# Patient Record
Sex: Female | Born: 1995 | Race: White | Hispanic: No | Smoking: Never smoker
Health system: Southern US, Community
[De-identification: ages and names within clinical notes are randomized; demographics above are authoritative.]

## PROBLEM LIST (undated history)

## (undated) DIAGNOSIS — N200 Calculus of kidney: Secondary | ICD-10-CM

## (undated) HISTORY — DX: Calculus of kidney: N20.0

## (undated) HISTORY — PX: MOUTH SURGERY: SHX715

---

## 2016-03-25 ENCOUNTER — Ambulatory Visit (INDEPENDENT_AMBULATORY_CARE_PROVIDER_SITE_OTHER): Payer: 59 | Admitting: Obstetrics and Gynecology

## 2016-03-25 ENCOUNTER — Encounter: Payer: Self-pay | Admitting: Obstetrics and Gynecology

## 2016-03-25 VITALS — BP 110/66 | HR 73 | Temp 98.0°F | Ht 64.0 in | Wt 148.0 lb

## 2016-03-25 DIAGNOSIS — Z01419 Encounter for gynecological examination (general) (routine) without abnormal findings: Secondary | ICD-10-CM | POA: Diagnosis not present

## 2016-03-25 NOTE — Progress Notes (Addendum)
Subjective:     Monique Barnett is a 20 y.o. female who is here for a comprehensive physical exam. The patient reports no problems. She reports normal menstrual cycles with 5 days of vaginal bleeding without passage of clots. She has been sexually active in the past without vaginal/anal penetration. She desires full STD testing today and declines contraception. She denies abnormal discharge and denies pelvic/abdominal pain  Past Medical History:  Diagnosis Date  . Kidney stone    Past Surgical History:  Procedure Laterality Date  . MOUTH SURGERY     Family History  Problem Relation Age of Onset  . Anxiety disorder Maternal Grandmother   . Cancer Maternal Grandfather   . Diabetes Paternal Grandfather     Social History   Social History  . Marital status: Single    Spouse name: N/A  . Number of children: N/A  . Years of education: N/A   Occupational History  . Not on file.   Social History Main Topics  . Smoking status: Never Smoker  . Smokeless tobacco: Never Used  . Alcohol use No  . Drug use: No  . Sexual activity: Not Currently    Partners: Male    Birth control/ protection: None   Other Topics Concern  . Not on file   Social History Narrative  . No narrative on file   Health Maintenance  Topic Date Due  . HIV Screening  01/06/2010  . TETANUS/TDAP  01/06/2014  . PAP SMEAR  01/07/2016  . INFLUENZA VACCINE  03/31/2016       Review of Systems Pertinent items are noted in HPI.   Objective:      GENERAL: Well-developed, well-nourished female in no acute distress.  HEENT: Normocephalic, atraumatic. Sclerae anicteric.  NECK: Supple. Normal thyroid.  LUNGS: Clear to auscultation bilaterally.  HEART: Regular rate and rhythm. BREASTS: Symmetric in size. No palpable masses or lymphadenopathy, skin changes, or nipple drainage. ABDOMEN: Soft, nontender, nondistended. No organomegaly. PELVIC: Normal external female genitalia. Vagina is pink and rugated.  Normal  discharge. Normal appearing cervix. Uterus is normal in size.  No adnexal mass or tenderness. EXTREMITIES: No cyanosis, clubbing, or edema, 2+ distal pulses.    Assessment:    Healthy female exam.      Plan:    Pap smear collected Discussed using condoms with every sexual encounter for STD prevention Full STD screen today Patient will be notified of abnormal results Patient reports receiving Gardasil series  See After Visit Summary for Counseling Recommendations

## 2016-03-26 LAB — RPR: RPR: NONREACTIVE

## 2016-03-26 LAB — HEPATITIS C ANTIBODY

## 2016-03-26 LAB — HIV ANTIBODY (ROUTINE TESTING W REFLEX): HIV Screen 4th Generation wRfx: NONREACTIVE

## 2016-03-27 LAB — GC/CHLAMYDIA PROBE AMP
CHLAMYDIA, DNA PROBE: NEGATIVE
NEISSERIA GONORRHOEAE BY PCR: NEGATIVE

## 2016-04-03 LAB — PAP IG W/ RFLX HPV ASCU: PAP SMEAR COMMENT: 0

## 2016-04-03 LAB — HPV DNA PROBE HIGH RISK, AMPLIFIED: HPV, HIGH-RISK: NEGATIVE

## 2016-04-20 ENCOUNTER — Telehealth: Payer: Self-pay | Admitting: Obstetrics and Gynecology

## 2016-04-20 NOTE — Telephone Encounter (Signed)
Called patient reviewed her results- let her know that she is OK to repeat her pap smear in a year

## 2016-04-20 NOTE — Telephone Encounter (Signed)
Pt was wondering if someone could giver her the results of her tests that were done back in July. Please advise.

## 2016-04-22 ENCOUNTER — Telehealth: Payer: Self-pay | Admitting: *Deleted

## 2016-04-22 NOTE — Telephone Encounter (Signed)
Patient made aware of lab results and need for repeat PAP in a year.

## 2016-05-05 NOTE — Telephone Encounter (Signed)
-----   Message from Arne ClevelandMandy J Hutchinson, [place] sent at 04/21/2016 10:14 AM EDT ----- [facility] and let her know her labs are normal per Dr. Jolayne Pantheronstant.   Thank you Janit BernMandy Hutchinson, CMA ----- Message ----- From: Catalina AntiguaPeggy Constant, MD Sent: 04/21/2016   7:30 AM To: Lenda KelpMandy J Hutchinson, CMA  Yes I did. Everything is normal  Thank you  Peggy   ----- Message ----- From: Arne ClevelandMandy J Hutchinson, CMA Sent: 04/20/2016   2:46 PM To: Catalina AntiguaPeggy Constant, MD  Have you had a chance to review these results?

## 2016-10-04 ENCOUNTER — Encounter (HOSPITAL_COMMUNITY): Payer: Self-pay

## 2016-10-04 ENCOUNTER — Emergency Department (HOSPITAL_COMMUNITY)
Admission: EM | Admit: 2016-10-04 | Discharge: 2016-10-04 | Disposition: A | Discharge status: Home / Self Care | Payer: Self-pay | Attending: Emergency Medicine | Admitting: Emergency Medicine

## 2016-10-04 DIAGNOSIS — R55 Syncope and collapse: Secondary | ICD-10-CM | POA: Insufficient documentation

## 2016-10-04 DIAGNOSIS — R42 Dizziness and giddiness: Secondary | ICD-10-CM

## 2016-10-04 LAB — I-STAT CHEM 8, ED
BUN: 20 mg/dL (ref 6–20)
CHLORIDE: 103 mmol/L (ref 101–111)
CREATININE: 0.7 mg/dL (ref 0.44–1.00)
Calcium, Ion: 1.18 mmol/L (ref 1.15–1.40)
Glucose, Bld: 89 mg/dL (ref 65–99)
HEMATOCRIT: 38 % (ref 36.0–46.0)
Hemoglobin: 12.9 g/dL (ref 12.0–15.0)
Potassium: 4.3 mmol/L (ref 3.5–5.1)
SODIUM: 139 mmol/L (ref 135–145)
TCO2: 26 mmol/L (ref 0–100)

## 2016-10-04 LAB — POC URINE PREG, ED: PREG TEST UR: NEGATIVE

## 2016-10-04 NOTE — ED Provider Notes (Signed)
[facility]-EMERGENCY DEPT Provider Note   CSN: [id number] Arrival date & time: 10/04/16  0209     History   Chief Complaint Chief Complaint  Patient presents with  . Blurred Vision  . dry mouth    HPI Monique Barnett is a 21 y.o. female.  The history is provided by the patient.  Dizziness  Quality:  Lightheadedness Severity:  Moderate Duration:  4 hours Timing:  Constant Progression:  Resolved Chronicity:  New Context comment:  Waking up from sleep Relieved by: sleep. Worsened by:  Nothing Associated symptoms: nausea and vision changes   Associated symptoms: no diarrhea and no headaches     Past Medical History:  Diagnosis Date  . Kidney stone     There are no active problems to display for this patient.   Past Surgical History:  Procedure Laterality Date  . MOUTH SURGERY      OB History    Gravida Para Term Preterm AB Living   0 0 0 0 0 0   SAB TAB Ectopic Multiple Live Births   0 0 0 0 0       Home Medications    Prior to Admission medications   Not on File    Family History Family History  Problem Relation Age of Onset  . Anxiety disorder Maternal Grandmother   . Cancer Maternal Grandfather   . Diabetes Paternal Grandfather     Social History Social History  Substance Use Topics  . Smoking status: Never Smoker  . Smokeless tobacco: Never Used  . Alcohol use No     Allergies   Patient has no known allergies.   Review of Systems Review of Systems  Gastrointestinal: Positive for nausea. Negative for diarrhea.  Neurological: Positive for dizziness. Negative for headaches.  Ten systems are reviewed and are negative for acute change except as noted in the HPI     Physical Exam Updated Vital Signs BP 106/71   Pulse 65   Temp 98 F (36.7 C) (Axillary)   Resp 18   Ht 5\' 5"  (1.651 m)   Wt 155 lb (70.3 kg)   LMP 10/02/2016 (Exact Date)   SpO2 100%   BMI 25.79 kg/m   Physical Exam  Constitutional: She is oriented to person,  place, and time. She appears well-developed and well-nourished. No distress.  HENT:  Head: Normocephalic and atraumatic.  Nose: Nose normal.  Eyes: Conjunctivae and EOM are normal. Pupils are equal, round, and reactive to light. Right eye exhibits no discharge. Left eye exhibits no discharge. No scleral icterus.  Neck: Normal range of motion. Neck supple.  Cardiovascular: Normal rate and regular rhythm.  Exam reveals no gallop and no friction rub.   No murmur heard. Pulmonary/Chest: Effort normal and breath sounds normal. No stridor. No respiratory distress. She has no rales.  Abdominal: Soft. She exhibits no distension. There is no tenderness.  Musculoskeletal: She exhibits no edema or tenderness.  Neurological: She is alert and oriented to person, place, and time.  Mental Status: Alert and oriented to person, place, and time. Attention and concentration normal. Speech clear. Recent memory is intac  Cranial Nerves  II Visual Fields: Intact to confrontation. Visual fields intact. III, IV, VI: Pupils equal and reactive to light and near. Full eye movement without nystagmus  V Facial Sensation: Normal. No weakness of masticatory muscles  VII: No facial weakness or asymmetry  VIII Auditory Acuity: Grossly normal  IX/X: The uvula is midline; the palate elevates symmetrically  XI: Normal  sternocleidomastoid and trapezius strength  XII: The tongue is midline. No atrophy or fasciculations.   Motor System: Muscle Strength: 5/5 and symmetric in the upper and lower extremities. No pronation or drift.  Muscle Tone: Tone and muscle bulk are normal in the upper and lower extremities.   Reflexes: DTRs: 2+ and symmetrical in all four extremities. Plantar responses are flexor bilaterally.  Coordination: No tremor.  Sensation: Intact to light touch. Gait: Routine gait normal   Skin: Skin is warm and dry. No rash noted. She is not diaphoretic. No erythema.  Psychiatric: She has a normal mood and  affect.  Vitals reviewed.    ED Treatments / Results  Labs (all labs ordered are listed, but only abnormal results are displayed) Labs Reviewed  I-STAT CHEM 8, ED  I-STAT BETA HCG BLOOD, ED ([facility], [facility], [facility] ONLY)  POC URINE PREG, ED    EKG  EKG Interpretation None       Radiology No results found.  Procedures Procedures (including critical care time)  Medications Ordered in ED Medications - No data to display   Initial Impression / Assessment and Plan / ED Course  I have reviewed the triage vital signs and the nursing notes.  Pertinent labs & imaging results that were available during my care of the patient were reviewed by me and considered in my medical decision making (see chart for details).     Etiology of patient's symptoms are undetermined however prior to my assessment she was asymptomatic. Check screening labs to assess for anemia given the heavy menstrual period as well as a urine pregnancy which is negative. Screening labs of CBC showing. Exam nonfocal.  The patient is safe for discharge with strict return precautions.   Final Clinical Impressions(s) / ED Diagnoses   Final diagnoses:  Lightheadedness   Disposition: Discharge  Condition: Good  I have discussed the results, Dx and Tx plan with the patient who expressed understanding and agree(s) with the plan. Discharge instructions discussed at great length. The patient was given strict return precautions who verbalized understanding of the instructions. No further questions at time of discharge.    There are no discharge medications for this patient.   Follow Up: primary care provider   As needed       Nira Conn, MD 10/04/16 [phone]

## 2016-10-04 NOTE — ED Triage Notes (Signed)
Pt endorses blurred vision x 1 week with dizziness that began tonight. Pt states "i really need to go to the eye dr" Pt also states "i woke up tonight and I my throat was so dry I couldn't swallow" Pt drinking water prior to triage in waiting room without difficulty. Pt has hx of anxiety and appears anxious in triage. NAD.

## 2016-10-04 NOTE — ED Notes (Signed)
Pt reports she has anxiety, not sure if that is what she is feeling.

## 2016-11-16 ENCOUNTER — Encounter: Payer: Self-pay | Admitting: Obstetrics

## 2016-11-16 ENCOUNTER — Ambulatory Visit (INDEPENDENT_AMBULATORY_CARE_PROVIDER_SITE_OTHER): Payer: 59 | Admitting: Obstetrics

## 2016-11-16 VITALS — BP 118/74 | HR 88 | Ht 60.0 in | Wt 163.0 lb

## 2016-11-16 DIAGNOSIS — N943 Premenstrual tension syndrome: Secondary | ICD-10-CM

## 2016-11-16 DIAGNOSIS — N946 Dysmenorrhea, unspecified: Secondary | ICD-10-CM

## 2016-11-16 DIAGNOSIS — Z113 Encounter for screening for infections with a predominantly sexual mode of transmission: Secondary | ICD-10-CM

## 2016-11-16 MED ORDER — IBUPROFEN 800 MG PO TABS: 800.0000 mg | ORAL_TABLET | Freq: Three times a day (TID) | ORAL | 5 refills | Status: DC | PRN

## 2016-11-16 NOTE — Progress Notes (Signed)
Pt requests STD testing today. Pt states she has not been exposed to STD she just wants testing. Pt does not desire BC at this time and next pap 02/2017.

## 2016-11-16 NOTE — Patient Instructions (Addendum)
Premenstrual Syndrome Premenstrual syndrome (PMS) is a group of physical, emotional, and behavioral symptoms that affect women of childbearing age. PMS starts 1-2 weeks before the start of a woman's period and goes away a few days after the period starts. It often recurs in a predictable pattern. PMS can range from mild to severe. When it is severe, it is called premenstrual dysphoric disorder (PMDD). PMS can interfere in many ways with normal daily activities. What are the causes? The cause of this condition is not known, but it seems to be related to hormone changes that happen before menstruation. What are the signs or symptoms? Symptoms of this condition often happen every month. They go away completely after your period starts. Physical symptoms include:  Bloating.  Breast pain.  Headaches.  Extreme fatigue.  Backaches.  Swelling of the hands and feet.  Weight gain.  Hot flashes. Emotional and behavioral symptoms include:  Mood swings.  Depression.  Angry outbursts.  Irritability.  Anxiety.  Crying spells.  Food cravings or appetite changes.  Changes in sexual desire.  Confusion.  Aggression.  Social withdrawal.  Poor concentration. How is this diagnosed? This condition is diagnosed if symptoms of PMS:  Are present in the 5 days before your period starts.  End within 4 days after your period starts.  Happen at least 3 months in a row.  Interfere with some of your normal activities. Other conditions that can cause some of these symptoms must be ruled out before PMS can be diagnosed. How is this treated? This condition may be treated by:  Maintaining a healthy lifestyle. This includes eating a balanced diet and exercising regularly.  Taking medicines. Medicines can help relieve symptoms such as cramps, aches, pains, headaches, and breast tenderness. Depending on the severity of the condition, your health care provider may  recommend:  Over-the-counter pain medicines.  Prescription medicines for PMDD. Follow these instructions at home: Eating and drinking    Eat a well-balanced diet.  Avoid caffeine and alcohol.  Limit the amount of salt and salty foods you eat. This will help lessen bloating.  Drink enough fluid to keep your urine clear or pale yellow.  Take a multivitamin if told to by your health care provider. Lifestyle   Do not use any tobacco products, such as cigarettes, chewing tobacco, and e-cigarettes. If you need help quitting, ask your health care provider.  Exercise regularly as suggested by your health care provider.  Get enough sleep.  Practice relaxation techniques.  Limit stress. Other Instructions   For 2-3 months, write down your symptoms, their severity, and how long they last. This will help your health care provider choose the best treatment for you.  Take over-the-counter and prescription medicines only as told by your health care provider.  If you are using oral contraceptive pills, use them as told by your health care provider. This information is not intended to replace advice given to you by your health care provider. Make sure you discuss any questions you have with your health care provider. Document Released: 08/14/2000 Document Revised: 09/18/2015 Document Reviewed: 05/17/2015 Elsevier Interactive Patient Education  2017 [employer]  Premenstrual Syndrome Premenstrual syndrome (PMS) is a group of physical, emotional, and behavioral symptoms that affect women of childbearing age. PMS starts 1-2 weeks before the start of a woman's period and goes away a few days after the period starts. It often recurs in a predictable pattern. PMS can range from mild to severe. When it is severe, it is  called premenstrual dysphoric disorder (PMDD). PMS can interfere in many ways with normal daily activities. What are the causes? The cause of this condition is not known, but it  seems to be related to hormone changes that happen before menstruation. What are the signs or symptoms? Symptoms of this condition often happen every month. They go away completely after your period starts. Physical symptoms include:  Bloating.  Breast pain.  Headaches.  Extreme fatigue.  Backaches.  Swelling of the hands and feet.  Weight gain.  Hot flashes. Emotional and behavioral symptoms include:  Mood swings.  Depression.  Angry outbursts.  Irritability.  Anxiety.  Crying spells.  Food cravings or appetite changes.  Changes in sexual desire.  Confusion.  Aggression.  Social withdrawal.  Poor concentration. How is this diagnosed? This condition is diagnosed if symptoms of PMS:  Are present in the 5 days before your period starts.  End within 4 days after your period starts.  Happen at least 3 months in a row.  Interfere with some of your normal activities. Other conditions that can cause some of these symptoms must be ruled out before PMS can be diagnosed. How is this treated? This condition may be treated by:  Maintaining a healthy lifestyle. This includes eating a balanced diet and exercising regularly.  Taking medicines. Medicines can help relieve symptoms such as cramps, aches, pains, headaches, and breast tenderness. Depending on the severity of the condition, your health care provider may recommend:  Over-the-counter pain medicines.  Prescription medicines for PMDD. Follow these instructions at home: Eating and drinking    Eat a well-balanced diet.  Avoid caffeine and alcohol.  Limit the amount of salt and salty foods you eat. This will help lessen bloating.  Drink enough fluid to keep your urine clear or pale yellow.  Take a multivitamin if told to by your health care provider. Lifestyle   Do not use any tobacco products, such as cigarettes, chewing tobacco, and e-cigarettes. If you need help quitting, ask your health care  provider.  Exercise regularly as suggested by your health care provider.  Get enough sleep.  Practice relaxation techniques.  Limit stress. Other Instructions   For 2-3 months, write down your symptoms, their severity, and how long they last. This will help your health care provider choose the best treatment for you.  Take over-the-counter and prescription medicines only as told by your health care provider.  If you are using oral contraceptive pills, use them as told by your health care provider. This information is not intended to replace advice given to you by your health care provider. Make sure you discuss any questions you have with your health care provider. Document Released: 08/14/2000 Document Revised: 09/18/2015 Document Reviewed: 05/17/2015 Elsevier Interactive Patient Education  2017 [employer]  Premenstrual Syndrome Premenstrual syndrome (PMS) is a group of physical, emotional, and behavioral symptoms that affect women of childbearing age. PMS starts 1-2 weeks before the start of a woman's period and goes away a few days after the period starts. It often recurs in a predictable pattern. PMS can range from mild to severe. When it is severe, it is called premenstrual dysphoric disorder (PMDD). PMS can interfere in many ways with normal daily activities. What are the causes? The cause of this condition is not known, but it seems to be related to hormone changes that happen before menstruation. What are the signs or symptoms? Symptoms of this condition often happen every month. They go away completely after your  period starts. Physical symptoms include:  Bloating.  Breast pain.  Headaches.  Extreme fatigue.  Backaches.  Swelling of the hands and feet.  Weight gain.  Hot flashes. Emotional and behavioral symptoms include:  Mood swings.  Depression.  Angry outbursts.  Irritability.  Anxiety.  Crying spells.  Food cravings or appetite  changes.  Changes in sexual desire.  Confusion.  Aggression.  Social withdrawal.  Poor concentration. How is this diagnosed? This condition is diagnosed if symptoms of PMS:  Are present in the 5 days before your period starts.  End within 4 days after your period starts.  Happen at least 3 months in a row.  Interfere with some of your normal activities. Other conditions that can cause some of these symptoms must be ruled out before PMS can be diagnosed. How is this treated? This condition may be treated by:  Maintaining a healthy lifestyle. This includes eating a balanced diet and exercising regularly.  Taking medicines. Medicines can help relieve symptoms such as cramps, aches, pains, headaches, and breast tenderness. Depending on the severity of the condition, your health care provider may recommend:  Over-the-counter pain medicines.  Prescription medicines for PMDD. Follow these instructions at home: Eating and drinking    Eat a well-balanced diet.  Avoid caffeine and alcohol.  Limit the amount of salt and salty foods you eat. This will help lessen bloating.  Drink enough fluid to keep your urine clear or pale yellow.  Take a multivitamin if told to by your health care provider. Lifestyle   Do not use any tobacco products, such as cigarettes, chewing tobacco, and e-cigarettes. If you need help quitting, ask your health care provider.  Exercise regularly as suggested by your health care provider.  Get enough sleep.  Practice relaxation techniques.  Limit stress. Other Instructions   For 2-3 months, write down your symptoms, their severity, and how long they last. This will help your health care provider choose the best treatment for you.  Take over-the-counter and prescription medicines only as told by your health care provider.  If you are using oral contraceptive pills, use them as told by your health care provider. This information is not intended  to replace advice given to you by your health care provider. Make sure you discuss any questions you have with your health care provider. Document Released: 08/14/2000 Document Revised: 09/18/2015 Document Reviewed: 05/17/2015 Elsevier Interactive Patient Education  2017 [employer] Dysmenorrhea Menstrual cramps (dysmenorrhea) are caused by the muscles of the uterus tightening (contracting) during a menstrual period. For some women, this discomfort is merely bothersome. For others, dysmenorrhea can be severe enough to interfere with everyday activities for a few days each month. Primary dysmenorrhea is menstrual cramps that last a couple of days when you start having menstrual periods or soon after. This often begins after a teenager starts having her period. As a woman gets older or has a baby, the cramps will usually lessen or disappear. Secondary dysmenorrhea begins later in life, lasts longer, and the pain may be stronger than primary dysmenorrhea. The pain may start before the period and last a few days after the period. What are the causes? Dysmenorrhea is usually caused by an underlying problem, such as:  The tissue lining the uterus grows outside of the uterus in other areas of the body (endometriosis).  The endometrial tissue, which normally lines the uterus, is found in or grows into the muscular walls of the uterus (adenomyosis).  The pelvic blood vessels are  engorged with blood just before the menstrual period (pelvic congestive syndrome).  Overgrowth of cells (polyps) in the lining of the uterus or cervix.  Falling down of the uterus (prolapse) because of loose or stretched ligaments.  Depression.  Bladder problems, infection, or inflammation.  Problems with the intestine, a tumor, or irritable bowel syndrome.  Cancer of the female organs or bladder.  A severely tipped uterus.  A very tight opening or closed cervix.  Noncancerous tumors of the uterus  (fibroids).  Pelvic inflammatory disease (PID).  Pelvic scarring (adhesions) from a previous surgery.  Ovarian cyst.  An intrauterine device (IUD) used for birth control. What increases the risk? You may be at greater risk of dysmenorrhea if:  You are younger than age 16.  You started puberty early.  You have irregular or heavy bleeding.  You have never given birth.  You have a family history of this problem.  You are a smoker. What are the signs or symptoms?  Cramping or throbbing pain in your lower abdomen.  Headaches.  Lower back pain.  Nausea or vomiting.  Diarrhea.  Sweating or dizziness.  Loose stools. How is this diagnosed? A diagnosis is based on your history, symptoms, physical exam, diagnostic tests, or procedures. Diagnostic tests or procedures may include:  Blood tests.  Ultrasonography.  An examination of the lining of the uterus (dilation and curettage, D&C).  An examination inside your abdomen or pelvis with a scope (laparoscopy).  X-rays.  CT scan.  MRI.  An examination inside the bladder with a scope (cystoscopy).  An examination inside the intestine or stomach with a scope (colonoscopy, gastroscopy). How is this treated? Treatment depends on the cause of the dysmenorrhea. Treatment may include:  Pain medicine prescribed by your health care provider.  Birth control pills or an IUD with progesterone hormone in it.  Hormone replacement therapy.  Nonsteroidal anti-inflammatory drugs (NSAIDs). These may help stop the production of prostaglandins.  Surgery to remove adhesions, endometriosis, ovarian cyst, or fibroids.  Removal of the uterus (hysterectomy).  Progesterone shots to stop the menstrual period.  Cutting the nerves on the sacrum that go to the female organs (presacral neurectomy).  Electric current to the sacral nerves (sacral nerve stimulation).  Antidepressant medicine.  Psychiatric therapy, counseling, or group  therapy.  Exercise and physical therapy.  Meditation and yoga therapy.  Acupuncture. Follow these instructions at home:  Only take over-the-counter or prescription medicines as directed by your health care provider.  Place a heating pad or hot water bottle on your lower back or abdomen. Do not sleep with the heating pad.  Use aerobic exercises, walking, swimming, biking, and other exercises to help lessen the cramping.  Massage to the lower back or abdomen may help.  Stop smoking.  Avoid alcohol and caffeine. Contact a health care provider if:  Your pain does not get better with medicine.  You have pain with sexual intercourse.  Your pain increases and is not controlled with medicines.  You have abnormal vaginal bleeding with your period.  You develop nausea or vomiting with your period that is not controlled with medicine. Get help right away if: You pass out. This information is not intended to replace advice given to you by your health care provider. Make sure you discuss any questions you have with your health care provider. Document Released: 08/17/2005 Document Revised: 01/23/2016 Document Reviewed: 02/02/2013 Elsevier Interactive Patient Education  2017 [employer].

## 2016-11-16 NOTE — Progress Notes (Signed)
Patient ID: Monique Barnett, female   DOB: 11/27/1995, 21 y.o.   MRN: [id number]  Chief Complaint  Patient presents with  . Exposure to STD    HPI Monique Barnett is a 21 y.o. female.  Requests STD screen.  Has a new partner and possible STD exposure.   HPI  Past Medical History:  Diagnosis Date  . Kidney stone     Past Surgical History:  Procedure Laterality Date  . MOUTH SURGERY      Family History  Problem Relation Age of Onset  . Anxiety disorder Maternal Grandmother   . Cancer Maternal Grandfather   . Diabetes Paternal Grandfather     Social History Social History  Substance Use Topics  . Smoking status: Never Smoker  . Smokeless tobacco: Never Used  . Alcohol use No    No Known Allergies  No current outpatient prescriptions on file.   No current facility-administered medications for this visit.     Review of Systems Review of Systems Constitutional: negative for fatigue and weight loss Respiratory: negative for cough and wheezing Cardiovascular: negative for chest pain, fatigue and palpitations Gastrointestinal: negative for abdominal pain and change in bowel habits Genitourinary:positive for painful cramping with periods with small clots Integument/breast: negative for nipple discharge Musculoskeletal:negative for myalgias Neurological: negative for gait problems and tremors Behavioral/Psych: positive for mood swings and irritability and crying ~ a week before the period Endocrine: negative for temperature intolerance      Blood pressure 118/74, pulse 88, height 5' (1.524 m), weight 163 lb (73.9 kg), last menstrual period 10/28/2016.  Physical Exam Physical Exam General:   alert  Skin:   no rash or abnormalities  Lungs:   clear to auscultation bilaterally  Heart:   regular rate and rhythm, S1, S2 normal, no murmur, click, rub or gallop  Breasts:   normal without suspicious masses, skin or nipple changes or axillary nodes  Abdomen:  normal findings:  no organomegaly, soft, non-tender and no hernia  Pelvis:  External genitalia: normal general appearance Urinary system: urethral meatus normal and bladder without fullness, nontender Vaginal: normal without tenderness, induration or masses Cervix: normal appearance Adnexa: normal bimanual exam Uterus: anteverted and non-tender, normal size    50% of 15 min visit spent on counseling and coordination of care.    Data Reviewed Wet Prep Cultures  Assessment     STD Screen Dysmenorrhea PMS    Plan    Wet Prep and Cultures done Ibuprofen Rx for Dysmenorrhea.  Educational brochure given. Instructions and brochure given for PMS F/U in July for Pap.   Orders Placed This Encounter  Procedures  . Hepatitis B surface antigen  . Hepatitis C antibody  . HIV antibody  . RPR   No orders of the defined types were placed in this encounter.

## 2016-11-17 LAB — CERVICOVAGINAL ANCILLARY ONLY
Bacterial vaginitis: NEGATIVE
CHLAMYDIA, DNA PROBE: NEGATIVE
Candida vaginitis: POSITIVE — AB
NEISSERIA GONORRHEA: NEGATIVE
TRICH (WINDOWPATH): NEGATIVE

## 2016-11-17 LAB — HEPATITIS C ANTIBODY

## 2016-11-17 LAB — RPR: RPR: NONREACTIVE

## 2016-11-17 LAB — HEPATITIS B SURFACE ANTIGEN: HEP B S AG: NEGATIVE

## 2016-11-17 LAB — HIV ANTIBODY (ROUTINE TESTING W REFLEX): HIV SCREEN 4TH GENERATION: NONREACTIVE

## 2016-11-18 ENCOUNTER — Other Ambulatory Visit: Payer: Self-pay | Admitting: Obstetrics

## 2016-11-18 DIAGNOSIS — B373 Candidiasis of vulva and vagina: Secondary | ICD-10-CM

## 2016-11-18 DIAGNOSIS — B3731 Acute candidiasis of vulva and vagina: Secondary | ICD-10-CM

## 2016-11-18 MED ORDER — FLUCONAZOLE 150 MG PO TABS: 150.0000 mg | ORAL_TABLET | Freq: Once | ORAL | 0 refills | Status: AC

## 2017-12-11 DIAGNOSIS — N200 Calculus of kidney: Secondary | ICD-10-CM | POA: Insufficient documentation

## 2017-12-12 ENCOUNTER — Other Ambulatory Visit: Payer: Self-pay

## 2017-12-12 ENCOUNTER — Encounter (HOSPITAL_COMMUNITY): Payer: Self-pay | Admitting: Emergency Medicine

## 2017-12-12 ENCOUNTER — Emergency Department (HOSPITAL_COMMUNITY)
Admission: EM | Admit: 2017-12-12 | Discharge: 2017-12-12 | Disposition: A | Discharge status: Home / Self Care | Payer: 59 | Attending: Emergency Medicine | Admitting: Emergency Medicine

## 2017-12-12 DIAGNOSIS — N200 Calculus of kidney: Secondary | ICD-10-CM

## 2017-12-12 LAB — URINALYSIS, ROUTINE W REFLEX MICROSCOPIC
BILIRUBIN URINE: NEGATIVE
Bacteria, UA: NONE SEEN
GLUCOSE, UA: NEGATIVE mg/dL
KETONES UR: NEGATIVE mg/dL
LEUKOCYTES UA: NEGATIVE
NITRITE: NEGATIVE
PH: 5 (ref 5.0–8.0)
PROTEIN: NEGATIVE mg/dL
Specific Gravity, Urine: 1.006 (ref 1.005–1.030)

## 2017-12-12 LAB — I-STAT BETA HCG BLOOD, ED (MC, WL, AP ONLY): I-stat hCG, quantitative: <5 m[IU]/mL (ref <5)

## 2017-12-12 LAB — CBC
HCT: 35.1 % — ABNORMAL LOW (ref 36.0–46.0)
Hemoglobin: 11.8 g/dL — ABNORMAL LOW (ref 12.0–15.0)
MCH: 31.5 pg (ref 26.0–34.0)
MCHC: 33.6 g/dL (ref 30.0–36.0)
MCV: 93.6 fL (ref 78.0–100.0)
PLATELETS: 226 10*3/uL (ref 150–400)
RBC: 3.75 MIL/uL — AB (ref 3.87–5.11)
RDW: 13.1 % (ref 11.5–15.5)
WBC: 6.7 10*3/uL (ref 4.0–10.5)

## 2017-12-12 LAB — COMPREHENSIVE METABOLIC PANEL
ALK PHOS: 79 U/L (ref 38–126)
ALT: 14 U/L (ref 14–54)
AST: 16 U/L (ref 15–41)
Albumin: 4 g/dL (ref 3.5–5.0)
Anion gap: 8 (ref 5–15)
BUN: 11 mg/dL (ref 6–20)
CALCIUM: 9.5 mg/dL (ref 8.9–10.3)
CO2: 23 mmol/L (ref 22–32)
CREATININE: 0.67 mg/dL (ref 0.44–1.00)
Chloride: 108 mmol/L (ref 101–111)
GFR calc Af Amer: >60 mL/min (ref >60)
GFR calc non Af Amer: >60 mL/min (ref >60)
Glucose, Bld: 119 mg/dL — ABNORMAL HIGH (ref 65–99)
Potassium: 3.7 mmol/L (ref 3.5–5.1)
Sodium: 139 mmol/L (ref 135–145)
Total Bilirubin: 0.5 mg/dL (ref 0.3–1.2)
Total Protein: 7.4 g/dL (ref 6.5–8.1)

## 2017-12-12 LAB — LIPASE, BLOOD: Lipase: 28 U/L (ref 11–51)

## 2017-12-12 MED ORDER — MORPHINE SULFATE 15 MG PO TABS: 15.0000 mg | ORAL_TABLET | ORAL | 0 refills | Status: DC | PRN

## 2017-12-12 NOTE — ED Triage Notes (Signed)
Pt has history of kidney stones and tonight began having left flank pain radiating around from her back.  Pt states she is nauseated and has had diarrhea.  Chills noted.  Pt states she 'cannot walk" d/t pain.

## 2017-12-12 NOTE — Discharge Instructions (Addendum)

## 2017-12-12 NOTE — ED Provider Notes (Signed)
[facility] Provider Note   CSN: [id number] Arrival date & time: 12/11/17  2336     History   Chief Complaint Chief Complaint  Patient presents with  . Flank Pain    HPI Monique Barnett is a 22 y.o. female.  22 yo F with a chief complaint of left sided flank pain.  This started acutely a couple hours ago.  Was so severe she had to stoop to walk.  Spontaneously improved.  Feels like her prior kidney stone that she has had.  Denies fevers or chills.  Denies dysuria increased frequency or hesitancy.  Has had some nausea but denies vomiting.  The history is provided by the patient.  Flank Pain  This is a recurrent problem. The current episode started 3 to 5 hours ago. The problem occurs constantly. The problem has been resolved. Pertinent negatives include no chest pain, no headaches and no shortness of breath. Nothing aggravates the symptoms. Nothing relieves the symptoms. She has tried nothing for the symptoms. The treatment provided no relief.    Past Medical History:  Diagnosis Date  . Kidney stone     There are no active problems to display for this patient.   Past Surgical History:  Procedure Laterality Date  . MOUTH SURGERY       OB History    Gravida  0   Para  0   Term  0   Preterm  0   AB  0   Living  0     SAB  0   TAB  0   Ectopic  0   Multiple  0   Live Births  0            Home Medications    Prior to Admission medications   Medication Sig Start Date End Date Taking? Authorizing Provider  ibuprofen (ADVIL,MOTRIN) 800 MG tablet Take 1 tablet (800 mg total) by mouth every 8 (eight) hours as needed. 11/16/16   Brock BadHarper, Charles A, MD  morphine (MSIR) 15 MG tablet Take 1 tablet (15 mg total) by mouth every 4 (four) hours as needed for severe pain. 12/12/17   Melene PlanFloyd, Kaylan Friedmann, DO    Family History Family History  Problem Relation Age of Onset  . Anxiety disorder Maternal Grandmother   . Cancer Maternal  Grandfather   . Diabetes Paternal Grandfather     Social History Social History   Tobacco Use  . Smoking status: Never Smoker  . Smokeless tobacco: Never Used  Substance Use Topics  . Alcohol use: No  . Drug use: No     Allergies   Patient has no known allergies.   Review of Systems Review of Systems  Constitutional: Negative for chills and fever.  HENT: Negative for congestion and rhinorrhea.   Eyes: Negative for redness and visual disturbance.  Respiratory: Negative for shortness of breath and wheezing.   Cardiovascular: Negative for chest pain and palpitations.  Gastrointestinal: Negative for nausea and vomiting.  Genitourinary: Positive for flank pain. Negative for dysuria and urgency.  Musculoskeletal: Negative for arthralgias and myalgias.  Skin: Negative for pallor and wound.  Neurological: Negative for dizziness and headaches.     Physical Exam Updated Vital Signs BP (!) 100/57   Pulse 61   Temp 98.5 F (36.9 C) (Oral)   LMP  (Exact Date) Comment: currently  SpO2 100%   Physical Exam  Constitutional: She is oriented to person, place, and time. She appears well-developed and well-nourished.  No distress.  HENT:  Head: Normocephalic and atraumatic.  Eyes: Pupils are equal, round, and reactive to light. EOM are normal.  Neck: Normal range of motion. Neck supple.  Cardiovascular: Normal rate and regular rhythm. Exam reveals no gallop and no friction rub.  No murmur heard. Pulmonary/Chest: Effort normal. She has no wheezes. She has no rales.  Abdominal: Soft. She exhibits no distension. There is no tenderness.  Musculoskeletal: She exhibits no edema or tenderness.  Neurological: She is alert and oriented to person, place, and time.  Skin: Skin is warm and dry. She is not diaphoretic.  Psychiatric: She has a normal mood and affect. Her behavior is normal.  Nursing note and vitals reviewed.    ED Treatments / Results  Labs (all labs ordered are listed,  but only abnormal results are displayed) Labs Reviewed  URINALYSIS, ROUTINE W REFLEX MICROSCOPIC - Abnormal; Notable for the following components:      Result Value   Color, Urine STRAW (*)    Hgb urine dipstick SMALL (*)    Squamous Epithelial / LPF 0-5 (*)    All other components within normal limits  COMPREHENSIVE METABOLIC PANEL - Abnormal; Notable for the following components:   Glucose, Bld 119 (*)    All other components within normal limits  CBC - Abnormal; Notable for the following components:   RBC 3.75 (*)    Hemoglobin 11.8 (*)    HCT 35.1 (*)    All other components within normal limits  LIPASE, BLOOD  I-STAT BETA HCG BLOOD, ED ([facility], [facility], [facility] ONLY)    EKG None  Radiology No results found.  Procedures Procedures (including critical care time) Emergency Focused Ultrasound Exam Limited Retroperitoneal Ultrasound of Kidneys  Performed and interpreted by Dr. Clydene Pugh Focused abdominal ultrasound with both kidneys imaged in transverse and longitudinal planes in real-time. Indication: flank pain Findings: bilateral kidneys present, no shadowing, no anechoic areas Interpretation: left hydronephrosis visualized.  no stones or cysts visualized  Images archived electronically  CPT Code: 78295  Medications Ordered in ED Medications - No data to display   Initial Impression / Assessment and Plan / ED Course  I have reviewed the triage vital signs and the nursing notes.  Pertinent labs & imaging results that were available during my care of the patient were reviewed by me and considered in my medical decision making (see chart for details).     22 yo F with a cc of L flank pain, hx of stones feels the same.  Pain well controlled without intervention here.  Bedside US with small hydro on the left, will treat as stone.  Urine not infected. Urology follow up.     I have discussed the diagnosis/risks/treatment options with the patient and family and believe the pt to be  eligible for discharge home to follow-up with Urology. We also discussed returning to the ED immediately if new or worsening sx occur. We discussed the sx which are most concerning (e.g., sudden worsening pain, fever, inability to tolerate by mouth) that necessitate immediate return. Medications administered to the patient during their visit and any new prescriptions provided to the patient are listed below.  Medications given during this visit Medications - No data to display   The patient appears reasonably screen and/or stabilized for discharge and I doubt any other medical condition or other [facility] requiring further screening, evaluation, or treatment in the ED at this time prior to discharge.    Final Clinical Impressions(s) / ED Diagnoses  Final diagnoses:  Nephrolithiasis    ED Discharge Orders        Ordered    morphine (MSIR) 15 MG tablet  Every 4 hours PRN     12/12/17 0321       Melene Plan, DO 12/12/17 [phone]

## 2018-07-06 ENCOUNTER — Encounter (HOSPITAL_COMMUNITY): Payer: Self-pay | Admitting: Emergency Medicine

## 2018-07-06 ENCOUNTER — Emergency Department (HOSPITAL_COMMUNITY)
Admission: EM | Admit: 2018-07-06 | Discharge: 2018-07-06 | Disposition: A | Discharge status: Home / Self Care | Payer: Self-pay | Attending: Emergency Medicine | Admitting: Emergency Medicine

## 2018-07-06 DIAGNOSIS — R69 Illness, unspecified: Secondary | ICD-10-CM

## 2018-07-06 DIAGNOSIS — J111 Influenza due to unidentified influenza virus with other respiratory manifestations: Secondary | ICD-10-CM | POA: Insufficient documentation

## 2018-07-06 MED ORDER — METOCLOPRAMIDE HCL 10 MG PO TABS: 10.0000 mg | ORAL_TABLET | Freq: Four times a day (QID) | ORAL | 0 refills | Status: DC | PRN

## 2018-07-06 NOTE — ED Notes (Signed)
E-signature not available, pt verbalized understanding of DC instructions and prescriptions 

## 2018-07-06 NOTE — ED Provider Notes (Signed)
[facility] EMERGENCY DEPARTMENT Provider Note   CSN: [id number] Arrival date & time: 07/06/18  2236     History   Chief Complaint Chief Complaint  Patient presents with  . flu like symptoms    HPI Shakeia Gramlich is a 22 y.o. female.  The history is provided by the patient.  She had onset 2 days ago of fever, chills and generalized body aches.  There has been associated headache, rhinorrhea, sore throat, cough.  Cough is productive of a small amount of sputum, but she has not looked at the color.  She has not looked at the color of her rhinorrhea.  Today, she had nausea and diarrhea, but both have resolved.  Temperature is been as high as 101.5.  She took Aleve and states she is feeling somewhat better.  She denies any sick contacts.  She has not had the influenza immunization.  Past Medical History:  Diagnosis Date  . Kidney stone     There are no active problems to display for this patient.   Past Surgical History:  Procedure Laterality Date  . MOUTH SURGERY       OB History    Gravida  0   Para  0   Term  0   Preterm  0   AB  0   Living  0     SAB  0   TAB  0   Ectopic  0   Multiple  0   Live Births  0            Home Medications    Prior to Admission medications   Medication Sig Start Date End Date Taking? Authorizing Provider  ibuprofen (ADVIL,MOTRIN) 800 MG tablet Take 1 tablet (800 mg total) by mouth every 8 (eight) hours as needed. 11/16/16   Brock Bad, MD  morphine (MSIR) 15 MG tablet Take 1 tablet (15 mg total) by mouth every 4 (four) hours as needed for severe pain. 12/12/17   Melene Plan, DO    Family History Family History  Problem Relation Age of Onset  . Anxiety disorder Maternal Grandmother   . Cancer Maternal Grandfather   . Diabetes Paternal Grandfather     Social History Social History   Tobacco Use  . Smoking status: Never Smoker  . Smokeless tobacco: Never Used  Substance Use Topics  .  Alcohol use: No  . Drug use: No     Allergies   Patient has no known allergies.   Review of Systems Review of Systems  All other systems reviewed and are negative.    Physical Exam Updated Vital Signs BP 124/73 (BP Location: Right Arm)   Pulse 94   Temp 99.7 F (37.6 C) (Oral)   Resp 18   Ht 5\' 4"  (1.626 m)   Wt 74.8 kg   SpO2 100%   BMI 28.32 kg/m   Physical Exam  Nursing note and vitals reviewed.  22 year old female, resting comfortably and in no acute distress. Vital signs are normal. Oxygen saturation is 100%, which is normal. Head is normocephalic and atraumatic. PERRLA, EOMI. Oropharynx is clear. Neck is nontender and supple without adenopathy or JVD. Back is nontender and there is no CVA tenderness. Lungs are clear without rales, wheezes, or rhonchi. Chest is nontender. Heart has regular rate and rhythm without murmur. Abdomen is soft, flat, nontender without masses or hepatosplenomegaly and peristalsis is normoactive. Extremities have no cyanosis or edema, full range of motion is  present. Skin is warm and dry without rash. Neurologic: Mental status is normal, cranial nerves are intact, there are no motor or sensory deficits.  ED Treatments / Results   Procedures Procedures (including critical care time)  Medications Ordered in ED Medications - No data to display   Initial Impression / Assessment and Plan / ED Course  I have reviewed the triage vital signs and the nursing notes.  Influenza-like illness.  She is beyond the window where antiviral medication would be helpful, so no indication for influenza testing.  Patient is advised on symptomatic treatment.  Old records are reviewed, and she has no relevant past visits.  Final Clinical Impressions(s) / ED Diagnoses   Final diagnoses:  Influenza-like illness    ED Discharge Orders    None       Dione Booze, MD 07/06/18 2318

## 2018-07-06 NOTE — Discharge Instructions (Signed)
Drink plenty of fluids.  Take naproxen (Aleve), ibuprofen (Motrin, Advil) or acetaminophen (Tylenol) as needed for fever or aching.  Take loperamide (Imodium AD) as needed for diarrhea.

## 2018-07-06 NOTE — ED Triage Notes (Signed)
Pt reports flu like symptoms for two days, fever, headache

## 2018-09-04 ENCOUNTER — Emergency Department (HOSPITAL_COMMUNITY)
Admission: EM | Admit: 2018-09-04 | Discharge: 2018-09-04 | Disposition: A | Discharge status: Home / Self Care | Payer: Self-pay | Attending: Emergency Medicine | Admitting: Emergency Medicine

## 2018-09-04 ENCOUNTER — Encounter (HOSPITAL_COMMUNITY): Payer: Self-pay

## 2018-09-04 ENCOUNTER — Other Ambulatory Visit: Payer: Self-pay

## 2018-09-04 DIAGNOSIS — R55 Syncope and collapse: Secondary | ICD-10-CM | POA: Insufficient documentation

## 2018-09-04 LAB — CBC
HEMATOCRIT: 36.5 % (ref 36.0–46.0)
Hemoglobin: 12.1 g/dL (ref 12.0–15.0)
MCH: 31.3 pg (ref 26.0–34.0)
MCHC: 33.2 g/dL (ref 30.0–36.0)
MCV: 94.3 fL (ref 80.0–100.0)
Platelets: 216 10*3/uL (ref 150–400)
RBC: 3.87 MIL/uL (ref 3.87–5.11)
RDW: 12.8 % (ref 11.5–15.5)
WBC: 5.5 10*3/uL (ref 4.0–10.5)
nRBC: 0 % (ref 0.0–0.2)

## 2018-09-04 LAB — BASIC METABOLIC PANEL
Anion gap: 9 (ref 5–15)
BUN: 8 mg/dL (ref 6–20)
CHLORIDE: 105 mmol/L (ref 98–111)
CO2: 22 mmol/L (ref 22–32)
CREATININE: 0.78 mg/dL (ref 0.44–1.00)
Calcium: 9.5 mg/dL (ref 8.9–10.3)
GFR calc Af Amer: >60 mL/min (ref >60)
GFR calc non Af Amer: >60 mL/min (ref >60)
Glucose, Bld: 110 mg/dL — ABNORMAL HIGH (ref 70–99)
Potassium: 3.7 mmol/L (ref 3.5–5.1)
Sodium: 136 mmol/L (ref 135–145)

## 2018-09-04 LAB — URINALYSIS, ROUTINE W REFLEX MICROSCOPIC
BILIRUBIN URINE: NEGATIVE
GLUCOSE, UA: NEGATIVE mg/dL
HGB URINE DIPSTICK: NEGATIVE
KETONES UR: NEGATIVE mg/dL
Leukocytes, UA: NEGATIVE
NITRITE: NEGATIVE
PH: 7 (ref 5.0–8.0)
Protein, ur: NEGATIVE mg/dL
Specific Gravity, Urine: 1.001 — ABNORMAL LOW (ref 1.005–1.030)

## 2018-09-04 LAB — PREGNANCY, URINE: Preg Test, Ur: NEGATIVE

## 2018-09-04 NOTE — Discharge Instructions (Signed)
Please schedule follow up with a cardiologist to be further evaluated for these episodes of passing out.   As soon as you experience 'pre-syncope' symptoms (hot, clammy, sweaty, nauseous), learn to immediately squat or lie down which could help avoid a complete blackout. If you are not able to lie down, cross your ankles and tense your calf-muscles as this will help to get the blood pumping around your body and increase your blood pressure, combine this movement with buttock clenching to make effects more pronounced which will alleviate the symptoms.

## 2018-09-04 NOTE — ED Provider Notes (Signed)
I saw and evaluated the patient, reviewed the resident's note and I agree with the findings and plan.  EKG: EKG Interpretation  Date/Time:  Sunday September 04 2018 15:32:31 EST Ventricular Rate:  91 PR Interval:  144 QRS Duration: 86 QT Interval:  356 QTC Calculation: 437 R Axis:   68 Text Interpretation:  Normal sinus rhythm Normal ECG No old tracing to compare Confirmed by Latimer, Doreatha Martin [phone] on 09/04/2018 3:38:57 PM Also confirmed by Lorre Nick ([id number])  on 09/04/2018 4:42:24 PM Also confirmed by Lorre Nick ([id number])  on 09/04/2018 5:45:58 PM  23 year old female who presents after near syncopal event.  Patient has had this x2 before in the past.  Are reassuring.  Has had no recent illnesses.  No neurological deficits.  Likely vasovagal and will give cardiology referral.   Lorre Nick, MD 09/04/18 1718

## 2018-09-04 NOTE — ED Provider Notes (Signed)
[facility] Provider [facility]   CSN: [id number] Arrival date & time: 09/04/18  1525   History   Chief Complaint Chief Complaint  Patient presents with  . Near Syncope    HPI Monique Barnett is a 23 y.o. female.  Patient reports that earlier today she was walking and shopping, felt hot and then all of a sudden was unable to see and started to fall. She was able to catch herself and did not hit her head or anything. She said it lasted maybe 5 seconds and then she was able to see perfectly fine but then her legs both felt a little tingly and hurt some. She reports eating and drinking a normal amount. She denies any nausea, vomiting , recent illness, fever or chills. She reports that this has happened twice before, once when she was biking and became dehydrated and once when she had kidney stones. She has a known heart murmur and has never been evaluated by a cardiologist.   The history is provided by the patient and the spouse.    Past Medical History:  Diagnosis Date  . Kidney stone     There are no active problems to display for this patient.   Past Surgical History:  Procedure Laterality Date  . MOUTH SURGERY       OB History    Gravida  0   Para  0   Term  0   Preterm  0   AB  0   Living  0     SAB  0   TAB  0   Ectopic  0   Multiple  0   Live Births  0            Home Medications    Prior to Admission medications   Medication Sig Start Date End Date Taking? Authorizing Provider  naproxen sodium (ALEVE) 220 MG tablet Take 220 mg by mouth daily as needed (pain).   Yes [provider]    Family History Family History  Problem Relation Age of Onset  . Anxiety disorder Maternal Grandmother   . Cancer Maternal Grandfather   . Diabetes Paternal Grandfather     Social History Social History   Tobacco Use  . Smoking status: Never Smoker  . Smokeless tobacco: Never Used  Substance Use Topics  .  Alcohol use: No  . Drug use: No     Allergies   Patient has no known allergies.   Review of Systems Review of Systems  Constitutional: Negative for fever.  Respiratory: Negative for chest tightness and shortness of breath.   Cardiovascular: Negative for chest pain.  Gastrointestinal: Negative for constipation, diarrhea, nausea and vomiting.  Genitourinary: Positive for frequency. Negative for dysuria.  Neurological: Positive for syncope. Negative for dizziness and light-headedness.  All other systems reviewed and are negative.   Physical Exam Updated Vital Signs BP 115/70 (BP Location: Right Arm)   Pulse 78   Temp 97.8 F (36.6 C) (Oral)   Resp 17   Ht 5\' 4"  (1.626 m)   Wt 72.6 kg   SpO2 100%   BMI 27.46 kg/m   Physical Exam Vitals signs and nursing note reviewed.  Constitutional:      Appearance: Normal appearance.  HENT:     Head: Normocephalic and atraumatic.     Nose: Nose normal.     Mouth/Throat:     Mouth: Mucous membranes are moist.     Pharynx: Oropharynx is clear.  Eyes:     Conjunctiva/sclera: Conjunctivae normal.     Pupils: Pupils are equal, round, and reactive to light.  Neck:     Musculoskeletal: Normal range of motion and neck supple.  Cardiovascular:     Rate and Rhythm: Normal rate and regular rhythm.     Pulses: Normal pulses.     Heart sounds: S1 normal and S2 normal. Murmur present.     Comments: 2/6 systolic murmur  Pulmonary:     Effort: Pulmonary effort is normal. No respiratory distress.     Breath sounds: Normal breath sounds.  Abdominal:     General: Abdomen is flat.     Palpations: Abdomen is soft.  Musculoskeletal: Normal range of motion.  Neurological:     General: No focal deficit present.     Mental Status: She is alert and oriented to person, place, and time. Mental status is at baseline.     Cranial Nerves: Cranial nerves are intact. No cranial nerve deficit.     Gait: Gait is intact.  Psychiatric:        Mood and  Affect: Mood normal.        Behavior: Behavior normal.        Thought Content: Thought content normal.        Judgment: Judgment normal.    ED Treatments / Results  Labs (all labs ordered are listed, but only abnormal results are displayed) Labs Reviewed  BASIC METABOLIC PANEL - Abnormal; Notable for the following components:      Result Value   Glucose, Bld 110 (*)    All other components within normal limits  URINALYSIS, ROUTINE W REFLEX MICROSCOPIC - Abnormal; Notable for the following components:   Color, Urine STRAW (*)    Specific Gravity, Urine 1.001 (*)    All other components within normal limits  CBC  PREGNANCY, URINE  CBG MONITORING, ED    EKG EKG Interpretation  Date/Time:  Sunday September 04 2018 15:32:31 EST Ventricular Rate:  91 PR Interval:  144 QRS Duration: 86 QT Interval:  356 QTC Calculation: 437 R Axis:   68 Text Interpretation:  Normal sinus rhythm Normal ECG No old tracing to compare Confirmed by [place], Doreatha Martin [id number] on 09/04/2018 3:38:57 PM Also confirmed by Lorre Nick ([id number])  on 09/04/2018 4:42:24 PM Also confirmed by Lorre Nick ([id number])  on 09/04/2018 5:10:03 PM   Radiology No results found.  Procedures Procedures (including critical care time)  Medications Ordered in ED Medications - No data to display   Initial Impression / Assessment and Plan / ED Course  I have reviewed the triage vital signs and the nursing notes.  Pertinent labs & imaging results that were available during my care of the patient were reviewed by me and considered in my medical decision making (see chart for details).   Patient with likely vasovagal syncope with normal CBC, CMP, UA and neg Upreg. Normal EKG. Orthostatic vitals wnl. Patient has had 2 other episodes of syncope. Given this will have patient evaluated further by cardiologist. She does have known cardiac murmur and does not endorse any chest pain, dizziness or shortness of breath. Patient now feeling  well and would like to go home. Does not have regular doctor, but encouraged to start seeing one.   Outpatient referral to cardiology for further work up of syncopal events and patient encouraged to drink plenty of fluids and counseled on counter-pressure maneuvers.   [place] Deandra Gadson, DO PGY-2, [facility]   Final  Clinical Impressions(s) / ED Diagnoses   Final diagnoses:  Near syncope    ED Discharge Orders         Ordered    Ambulatory referral to Cardiology    Comments:  Near syncopal event with 2 previous events.   09/04/18 1730           Hannah Strader, [place], DO 09/04/18 1755    Lorre NickAllen, Anthony, MD 09/04/18 [phone]

## 2018-09-04 NOTE — ED Triage Notes (Signed)
Pt arrives POV for eval of near syncope and leg cramping. Pt reports she was in a store, became very hot and started experiencing leg cramps. Pt reports she "blacked out" for a second and then caught herself on a shelf. Pt denies hitting head, denies total LOC, N/V. Pt reports she is experiencing tingling in blt feel, but cramping has subsided. Denies CP/SOB/palp.

## 2019-03-20 ENCOUNTER — Other Ambulatory Visit: Payer: Self-pay

## 2019-03-20 ENCOUNTER — Emergency Department (HOSPITAL_COMMUNITY): Payer: Self-pay

## 2019-03-20 ENCOUNTER — Encounter (HOSPITAL_COMMUNITY): Payer: Self-pay | Admitting: *Deleted

## 2019-03-20 ENCOUNTER — Emergency Department (HOSPITAL_COMMUNITY)
Admission: EM | Admit: 2019-03-20 | Discharge: 2019-03-20 | Disposition: A | Discharge status: Home / Self Care | Payer: Self-pay | Attending: Emergency Medicine | Admitting: Emergency Medicine

## 2019-03-20 DIAGNOSIS — B9689 Other specified bacterial agents as the cause of diseases classified elsewhere: Secondary | ICD-10-CM

## 2019-03-20 DIAGNOSIS — Z79899 Other long term (current) drug therapy: Secondary | ICD-10-CM | POA: Insufficient documentation

## 2019-03-20 DIAGNOSIS — N760 Acute vaginitis: Secondary | ICD-10-CM | POA: Insufficient documentation

## 2019-03-20 DIAGNOSIS — R1033 Periumbilical pain: Secondary | ICD-10-CM | POA: Insufficient documentation

## 2019-03-20 LAB — WET PREP, GENITAL
Sperm: NONE SEEN
Trich, Wet Prep: NONE SEEN
Yeast Wet Prep HPF POC: NONE SEEN

## 2019-03-20 LAB — I-STAT BETA HCG BLOOD, ED (MC, WL, AP ONLY): I-stat hCG, quantitative: <5 m[IU]/mL (ref <5)

## 2019-03-20 LAB — CBC
HCT: 36.1 % (ref 36.0–46.0)
Hemoglobin: 12.2 g/dL (ref 12.0–15.0)
MCH: 31.8 pg (ref 26.0–34.0)
MCHC: 33.8 g/dL (ref 30.0–36.0)
MCV: 94 fL (ref 80.0–100.0)
Platelets: 195 10*3/uL (ref 150–400)
RBC: 3.84 MIL/uL — ABNORMAL LOW (ref 3.87–5.11)
RDW: 12.4 % (ref 11.5–15.5)
WBC: 4.7 10*3/uL (ref 4.0–10.5)
nRBC: 0 % (ref 0.0–0.2)

## 2019-03-20 LAB — COMPREHENSIVE METABOLIC PANEL
ALT: 14 U/L (ref 0–44)
AST: 15 U/L (ref 15–41)
Albumin: 4.1 g/dL (ref 3.5–5.0)
Alkaline Phosphatase: 70 U/L (ref 38–126)
Anion gap: 9 (ref 5–15)
BUN: 11 mg/dL (ref 6–20)
CO2: 23 mmol/L (ref 22–32)
Calcium: 9.1 mg/dL (ref 8.9–10.3)
Chloride: 106 mmol/L (ref 98–111)
Creatinine, Ser: 0.78 mg/dL (ref 0.44–1.00)
GFR calc Af Amer: >60 mL/min (ref >60)
GFR calc non Af Amer: >60 mL/min (ref >60)
Glucose, Bld: 106 mg/dL — ABNORMAL HIGH (ref 70–99)
Potassium: 4 mmol/L (ref 3.5–5.1)
Sodium: 138 mmol/L (ref 135–145)
Total Bilirubin: 0.6 mg/dL (ref 0.3–1.2)
Total Protein: 6.7 g/dL (ref 6.5–8.1)

## 2019-03-20 LAB — LIPASE, BLOOD: Lipase: 24 U/L (ref 11–51)

## 2019-03-20 LAB — URINALYSIS, ROUTINE W REFLEX MICROSCOPIC
Bilirubin Urine: NEGATIVE
Glucose, UA: NEGATIVE mg/dL
Hgb urine dipstick: NEGATIVE
Ketones, ur: NEGATIVE mg/dL
Leukocytes,Ua: NEGATIVE
Nitrite: NEGATIVE
Protein, ur: NEGATIVE mg/dL
Specific Gravity, Urine: 1.024 (ref 1.005–1.030)
pH: 6 (ref 5.0–8.0)

## 2019-03-20 MED ORDER — DICYCLOMINE HCL 20 MG PO TABS: 20.0000 mg | ORAL_TABLET | Freq: Two times a day (BID) | ORAL | 0 refills | Status: DC

## 2019-03-20 MED ORDER — SODIUM CHLORIDE 0.9 % IV BOLUS
1000.0000 mL | Freq: Once | INTRAVENOUS | Status: AC
  Administered 2019-03-20: 1000 mL via INTRAVENOUS

## 2019-03-20 MED ORDER — ONDANSETRON HCL 4 MG/2ML IJ SOLN
4.0000 mg | Freq: Once | INTRAMUSCULAR | Status: AC
  Administered 2019-03-20: 4 mg via INTRAVENOUS
  Filled 2019-03-20: qty 2

## 2019-03-20 MED ORDER — MORPHINE SULFATE (PF) 4 MG/ML IV SOLN
4.0000 mg | Freq: Once | INTRAVENOUS | Status: AC
  Administered 2019-03-20: 4 mg via INTRAVENOUS
  Filled 2019-03-20: qty 1

## 2019-03-20 MED ORDER — SODIUM CHLORIDE 0.9% FLUSH: 3.0000 mL | Freq: Once | INTRAVENOUS | Status: DC

## 2019-03-20 MED ORDER — IOHEXOL 300 MG/ML  SOLN
100.0000 mL | Freq: Once | INTRAMUSCULAR | Status: AC | PRN
  Administered 2019-03-20: 100 mL via INTRAVENOUS

## 2019-03-20 MED ORDER — ONDANSETRON 4 MG PO TBDP: 4.0000 mg | ORAL_TABLET | Freq: Three times a day (TID) | ORAL | 0 refills | Status: DC | PRN

## 2019-03-20 MED ORDER — METRONIDAZOLE 500 MG PO TABS: 500.0000 mg | ORAL_TABLET | Freq: Two times a day (BID) | ORAL | 0 refills | Status: DC

## 2019-03-20 NOTE — ED Provider Notes (Signed)
[facility]   CSN: [id number] Arrival date & time: 03/20/19  [id number]    History   Chief Complaint Chief Complaint  Patient presents with   Abdominal Pain    HPI Monique Barnett is a 23 y.o. female.     HPI  Monique Barnett is a 23 y.o. female, with a history of kidney stone, presenting to the ED with abdominal pain beginning around 6:30 am this morning. Woke patient from a sleep. Pain is mostly periumbilical and epigastric, radiating throughout the abdomen, sharp, constant, rated 10/10. Accompanied by intermittent right lower back pain. She has not experienced this pain before.  She also notes clear vaginal discharge over the last 3 days.  She mentions urinary frequency, however, also states, "I have also been drinking a lot of water as well."  She states her symptoms do not feel like UTIs she has had in the past. She is sexually active with her husband only. Last BM was yesterday and was normal.  Denies fever/chills, N/V/C/D, vaginal bleeding, dysuria, hematuria, hematochezia/melena, or any other complaints.   Past Medical History:  Diagnosis Date   Kidney stone     There are no active problems to display for this patient.   Past Surgical History:  Procedure Laterality Date   MOUTH SURGERY       OB History    Gravida  0   Para  0   Term  0   Preterm  0   AB  0   Living  0     SAB  0   TAB  0   Ectopic  0   Multiple  0   Live Births  0            Home Medications    Prior to Admission medications   Medication Sig Start Date End Date Taking? Authorizing Provider  bismuth subsalicylate (PEPTO BISMOL) 262 MG/15ML suspension Take 10 mLs by mouth every 6 (six) hours as needed for indigestion.   Yes [provider]  naproxen sodium (ALEVE) 220 MG tablet Take 220 mg by mouth daily as needed (pain).   Yes [provider]  dicyclomine (BENTYL) 20 MG tablet Take 1 tablet (20 mg  total) by mouth 2 (two) times daily. 03/20/19   Serena Petterson C, PA-C  metroNIDAZOLE (FLAGYL) 500 MG tablet Take 1 tablet (500 mg total) by mouth 2 (two) times daily. 03/20/19   Leroy Pettway C, PA-C  ondansetron (ZOFRAN ODT) 4 MG disintegrating tablet Take 1 tablet (4 mg total) by mouth every 8 (eight) hours as needed for nausea or vomiting. 03/20/19   Kassey Laforest, Hillard DankerShawn C, PA-C    Family History Family History  Problem Relation Age of Onset   Anxiety disorder Maternal Grandmother    Cancer Maternal Grandfather    Diabetes Paternal Grandfather     Social History Social History   Tobacco Use   Smoking status: Never Smoker   Smokeless tobacco: Never Used  Substance Use Topics   Alcohol use: No   Drug use: No     Allergies   Patient has no known allergies.   Review of Systems Review of Systems  Constitutional: Negative for chills, diaphoresis and fever.  Respiratory: Negative for cough and shortness of breath.   Cardiovascular: Negative for chest pain.  Gastrointestinal: Positive for abdominal pain. Negative for blood in stool, constipation, diarrhea, nausea and vomiting.  Genitourinary: Positive for frequency and vaginal discharge. Negative for dysuria, flank pain,  hematuria and vaginal bleeding.  Musculoskeletal: Positive for back pain.  All other systems reviewed and are negative.    Physical Exam Updated Vital Signs BP 121/86 (BP Location: Right Arm)    Pulse 98    Temp 98.1 F (36.7 C) (Oral)    Resp (!) 22    LMP 03/13/2019    SpO2 100%   Physical Exam Vitals signs and nursing note reviewed.  Constitutional:      General: She is in acute distress (pain).     Appearance: She is well-developed. She is not diaphoretic.  HENT:     Head: Normocephalic and atraumatic.     Mouth/Throat:     Mouth: Mucous membranes are moist.     Pharynx: Oropharynx is clear.  Eyes:     Conjunctiva/sclera: Conjunctivae normal.  Neck:     Musculoskeletal: Neck supple.  Cardiovascular:      Rate and Rhythm: Normal rate and regular rhythm.     Pulses: Normal pulses.          Radial pulses are 2+ on the right side and 2+ on the left side.       Posterior tibial pulses are 2+ on the right side and 2+ on the left side.     Heart sounds: Normal heart sounds.     Comments: Tactile temperature in the extremities appropriate and equal bilaterally. Pulmonary:     Effort: Pulmonary effort is normal. No respiratory distress.     Breath sounds: Normal breath sounds.  Abdominal:     Palpations: Abdomen is soft.     Tenderness: There is generalized abdominal tenderness. There is guarding. There is no right CVA tenderness or left CVA tenderness.  Genitourinary:    Vagina: Vaginal discharge present.     Comments: External genitalia normal Vagina with discharge - moderate amount of thick, white discharge Cervix  normal negative for cervical motion tenderness Adnexa palpated, no masses, negative for tenderness noted Bladder palpated negative for tenderness Uterus palpated no masses, negative for tenderness  No inguinal lymphadenopathy. Otherwise normal female genitalia. RN/Med Tech served as chaperone during exam. Musculoskeletal:     Right lower leg: No edema.     Left lower leg: No edema.  Lymphadenopathy:     Cervical: No cervical adenopathy.  Skin:    General: Skin is warm and dry.  Neurological:     Mental Status: She is alert.  Psychiatric:        Mood and Affect: Mood and affect normal.        Speech: Speech normal.        Behavior: Behavior normal.      ED Treatments / Results  Labs (all labs ordered are listed, but only abnormal results are displayed) Labs Reviewed  WET PREP, GENITAL - Abnormal; Notable for the following components:      Result Value   Clue Cells Wet Prep HPF POC PRESENT (*)    WBC, Wet Prep HPF POC MANY (*)    All other components within normal limits  COMPREHENSIVE METABOLIC PANEL - Abnormal; Notable for the following components:   Glucose,  Bld 106 (*)    All other components within normal limits  CBC - Abnormal; Notable for the following components:   RBC 3.84 (*)    All other components within normal limits  URINALYSIS, ROUTINE W REFLEX MICROSCOPIC - Abnormal; Notable for the following components:   APPearance CLOUDY (*)    All other components within normal limits  URINE CULTURE  LIPASE, BLOOD  RPR  HIV ANTIBODY (ROUTINE TESTING W REFLEX)  I-STAT BETA HCG BLOOD, ED ([facility], [facility], [facility] ONLY)  GC/CHLAMYDIA PROBE AMP ([place]) NOT [facility]    EKG None  Radiology Ct Abdomen Pelvis W Contrast  Result Date: 03/20/2019 CLINICAL DATA:  Upper abdominal pain EXAM: CT ABDOMEN AND PELVIS WITH CONTRAST TECHNIQUE: Multidetector CT imaging of the abdomen and pelvis was performed using the standard protocol following bolus administration of intravenous contrast. CONTRAST:  100mL OMNIPAQUE IOHEXOL 300 MG/ML  SOLN COMPARISON:  None. FINDINGS: Lower chest: No acute abnormality. Hepatobiliary: No solid liver abnormality is seen. No gallstones, gallbladder wall thickening, or biliary dilatation. Pancreas: Unremarkable. No pancreatic ductal dilatation or surrounding inflammatory changes. Spleen: Normal in size without significant abnormality. Adrenals/Urinary Tract: Adrenal glands are unremarkable. Kidneys are normal, without renal calculi, solid lesion, or hydronephrosis. Bladder is unremarkable. Stomach/Bowel: Stomach is within normal limits. Appendix appears normal. No evidence of bowel wall thickening, distention, or inflammatory changes. Vascular/Lymphatic: No significant vascular findings are present. No enlarged abdominal or pelvic lymph nodes. Reproductive: No mass or other significant abnormality. Fluid attenuation cysts of the right ovary. Corpus luteum of the left ovary. Other: No abdominal wall hernia or abnormality. No abdominopelvic ascites. Musculoskeletal: No acute or significant osseous findings. IMPRESSION: No acute CT findings of the  abdomen or pelvis to explain upper abdominal pain. Electronically Signed   By: Lauralyn PrimesAlex  Bibbey M.D.   On: 03/20/2019 13:59    Procedures Pelvic exam  Date/Time: 03/20/2019 9:55 AM Performed by: Anselm PancoastJoy, Braxton Vantrease C, PA-C Authorized by: Anselm PancoastJoy, Maekayla Giorgio C, PA-C  Consent: Verbal consent obtained. Risks and benefits: risks, benefits and alternatives were discussed Consent given by: patient Patient understanding: patient states understanding of the procedure being performed Patient identity confirmed: verbally with patient and provided demographic data Local anesthesia used: no  Anesthesia: Local anesthesia used: no  Sedation: Patient sedated: no  Patient tolerance: patient tolerated the procedure well with no immediate complications    (including critical care time)  Medications Ordered in ED Medications  sodium chloride flush (NS) 0.9 % injection 3 mL (3 mLs Intravenous Not Given 03/20/19 1237)  sodium chloride 0.9 % bolus 1,000 mL (0 mLs Intravenous Stopped 03/20/19 1042)  morphine 4 MG/ML injection 4 mg (4 mg Intravenous Given 03/20/19 0903)  ondansetron (ZOFRAN) injection 4 mg (4 mg Intravenous Given 03/20/19 0904)  iohexol (OMNIPAQUE) 300 MG/ML solution 100 mL (100 mLs Intravenous Contrast Given 03/20/19 1349)     Initial Impression / Assessment and Plan / ED Course  I have reviewed the triage vital signs and the nursing notes.  Pertinent labs & imaging results that were available during my care of the patient were reviewed by me and considered in my medical decision making (see chart for details).  Clinical Course as of Mar 19 1454  Mon Mar 20, 2019  0930 Patient voices almost complete resolution in her pain following morphine.  Repeat abdominal exam localizes pain and tenderness to the periumbilical and epigastric regions.   [SJ]  1417 Discussed CT and all lab results. States pain is 2/10.   [SJ]    Clinical Course User Index [SJ] Huntley Demedeiros C, PA-C       Patient presents with  abdominal pain. Patient is nontoxic appearing, afebrile, not tachycardic, not tachypneic, not hypotensive, maintains excellent SPO2 on room air.  Clue cells with indication of BV on wet prep.  Lab work otherwise reassuring.  CT without acute abnormality. Improvement in patient's pain without recurrence.  The patient was given instructions for home care as well as return precautions. Patient voices understanding of these instructions, accepts the plan, and is comfortable with discharge.  Vitals:   03/20/19 0654 03/20/19 1417 03/20/19 1419  BP: 121/86 109/71   Pulse: 98  67  Resp: (!) 22    Temp: 98.1 F (36.7 C)    TempSrc: Oral    SpO2: 100%  100%     Final Clinical Impressions(s) / ED Diagnoses   Final diagnoses:  Periumbilical abdominal pain  BV (bacterial vaginosis)    ED Discharge Orders         Ordered    dicyclomine (BENTYL) 20 MG tablet  2 times daily     03/20/19 1435    metroNIDAZOLE (FLAGYL) 500 MG tablet  2 times daily     03/20/19 1435    ondansetron (ZOFRAN ODT) 4 MG disintegrating tablet  Every 8 hours PRN     03/20/19 1435           Lorayne Bender, PA-C 03/20/19 1457    Julianne Rice, MD 03/21/19 [phone]

## 2019-03-20 NOTE — Discharge Instructions (Addendum)
Abdominal discomfort  Hand washing: Wash your hands throughout the day, but especially before and after touching the face, using the restroom, sneezing, coughing, or touching surfaces that have been coughed or sneezed upon. Hydration: Symptoms will be intensified and complicated by dehydration. Dehydration can also extend the duration of symptoms. Drink plenty of fluids and get plenty of rest. You should be drinking at least half a liter of water an hour to stay hydrated. Electrolyte drinks (ex. Gatorade, Powerade, Pedialyte) are also encouraged. You should be drinking enough fluids to make your urine light yellow, almost clear. If this is not the case, you are not drinking enough water. Please note that some of the treatments indicated below will not be effective if you are not adequately hydrated. Diet: Please concentrate on hydration, however, you may introduce food slowly.  Start with a clear liquid diet, progressed to a full liquid diet, and then bland solids as you are able. Pain or fever: Ibuprofen, Naproxen, or Tylenol for pain or fever.  Nausea/vomiting: Use the Zofran for nausea or vomiting. Bentyl: This medication is what is known as an antispasmodic and is intended to help reduce abdominal discomfort. Follow-up: Follow-up with a primary care provider on this matter. Return: Return should you develop a fever, bloody diarrhea, increased abdominal pain, uncontrolled vomiting, or any other major concerns.  For prescription assistance, may try using prescription discount sites or apps, such as goodrx.com

## 2019-03-20 NOTE — ED Triage Notes (Signed)
Pt reports waking up with generalized abd cramping. Denies n/v/d.

## 2019-03-20 NOTE — ED Notes (Signed)
Patient transported to CT 

## 2019-03-20 NOTE — ED Notes (Signed)
Patient verbalizes understanding of discharge instructions. Opportunity for questioning and answers were provided. Armband removed by staff, pt discharged from ED.  

## 2019-03-21 LAB — URINE CULTURE: Culture: NO GROWTH

## 2019-03-21 LAB — GC/CHLAMYDIA PROBE AMP (~~LOC~~) NOT AT ARMC
Chlamydia: NEGATIVE
Neisseria Gonorrhea: NEGATIVE

## 2019-03-21 LAB — HIV ANTIBODY (ROUTINE TESTING W REFLEX): HIV Screen 4th Generation wRfx: NONREACTIVE

## 2019-03-21 LAB — RPR: RPR Ser Ql: NONREACTIVE

## 2019-07-11 ENCOUNTER — Other Ambulatory Visit: Payer: Self-pay | Admitting: Cardiology

## 2019-07-11 DIAGNOSIS — Z20822 Contact with and (suspected) exposure to covid-19: Secondary | ICD-10-CM

## 2019-07-13 LAB — NOVEL CORONAVIRUS, NAA: SARS-CoV-2, NAA: NOT DETECTED

## 2020-04-08 ENCOUNTER — Encounter: Payer: Self-pay | Admitting: Obstetrics & Gynecology

## 2020-04-08 ENCOUNTER — Other Ambulatory Visit: Payer: Self-pay

## 2020-04-08 ENCOUNTER — Ambulatory Visit (INDEPENDENT_AMBULATORY_CARE_PROVIDER_SITE_OTHER): Payer: 59 | Admitting: Obstetrics & Gynecology

## 2020-04-08 ENCOUNTER — Other Ambulatory Visit (HOSPITAL_COMMUNITY)
Admission: RE | Admit: 2020-04-08 | Discharge: 2020-04-08 | Disposition: A | Discharge status: Home / Self Care | Payer: Self-pay | Source: Ambulatory Visit | Attending: Obstetrics & Gynecology | Admitting: Obstetrics & Gynecology

## 2020-04-08 VITALS — BP 120/80 | Wt 190.0 lb

## 2020-04-08 DIAGNOSIS — Z124 Encounter for screening for malignant neoplasm of cervix: Secondary | ICD-10-CM

## 2020-04-08 DIAGNOSIS — Z3403 Encounter for supervision of normal first pregnancy, third trimester: Secondary | ICD-10-CM | POA: Insufficient documentation

## 2020-04-08 DIAGNOSIS — Z3491 Encounter for supervision of normal pregnancy, unspecified, first trimester: Secondary | ICD-10-CM

## 2020-04-08 DIAGNOSIS — Z3A01 Less than 8 weeks gestation of pregnancy: Secondary | ICD-10-CM

## 2020-04-08 DIAGNOSIS — Z369 Encounter for antenatal screening, unspecified: Secondary | ICD-10-CM

## 2020-04-08 MED ORDER — PROMETHAZINE HCL 25 MG PO TABS: 25.0000 mg | ORAL_TABLET | Freq: Four times a day (QID) | ORAL | 2 refills | Status: DC | PRN

## 2020-04-08 NOTE — Patient Instructions (Signed)
Thank you for choosing [facility]. As part of our ongoing efforts to improve patient experience, we would appreciate your feedback. Please fill out the short survey that you will receive by mail or MyChart. Your opinion is important to [place]! -Dr Tiburcio Pea   First Trimester of Pregnancy The first trimester of pregnancy is from week 1 until the end of week 13 (months 1 through 3). A week after a sperm fertilizes an egg, the egg will implant on the wall of the uterus. This embryo will begin to develop into a baby. Genes from you and your partner will form the baby. The female genes will determine whether the baby will be a boy or a girl. At 6-8 weeks, the eyes and face will be formed, and the heartbeat can be seen on ultrasound. At the end of 12 weeks, all the baby's organs will be formed. Now that you are pregnant, you will want to do everything you can to have a healthy baby. Two of the most important things are to get good prenatal care and to follow your health care provider's instructions. Prenatal care is all the medical care you receive before the baby's birth. This care will help prevent, find, and treat any problems during the pregnancy and childbirth. Body changes during your first trimester Your body goes through many changes during pregnancy. The changes vary from woman to woman.  You may gain or lose a couple of pounds at first.  You may feel sick to your stomach (nauseous) and you may throw up (vomit). If the vomiting is uncontrollable, call your health care provider.  You may tire easily.  You may develop headaches that can be relieved by medicines. All medicines should be approved by your health care provider.  You may urinate more often. Painful urination may mean you have a bladder infection.  You may develop heartburn as a result of your pregnancy.  You may develop constipation because certain hormones are causing the muscles that push stool through your intestines to slow  down.  You may develop hemorrhoids or swollen veins (varicose veins).  Your breasts may begin to grow larger and become tender. Your nipples may stick out more, and the tissue that surrounds them (areola) may become darker.  Your gums may bleed and may be sensitive to brushing and flossing.  Dark spots or blotches (chloasma, mask of pregnancy) may develop on your face. This will likely fade after the baby is born.  Your menstrual periods will stop.  You may have a loss of appetite.  You may develop cravings for certain kinds of food.  You may have changes in your emotions from day to day, such as being excited to be pregnant or being concerned that something may go wrong with the pregnancy and baby.  You may have more vivid and strange dreams.  You may have changes in your hair. These can include thickening of your hair, rapid growth, and changes in texture. Some women also have hair loss during or after pregnancy, or hair that feels dry or thin. Your hair will most likely return to normal after your baby is born. What to expect at prenatal visits During a routine prenatal visit:  You will be weighed to make sure you and the baby are growing normally.  Your blood pressure will be taken.  Your abdomen will be measured to track your baby's growth.  The fetal heartbeat will be listened to between weeks 10 and 14 of your pregnancy.  Test  results from any previous visits will be discussed. Your health care provider may ask you:  How you are feeling.  If you are feeling the baby move.  If you have had any abnormal symptoms, such as leaking fluid, bleeding, severe headaches, or abdominal cramping.  If you are using any tobacco products, including cigarettes, chewing tobacco, and electronic cigarettes.  If you have any questions. Other tests that may be performed during your first trimester include:  Blood tests to find your blood type and to check for the presence of any  previous infections. The tests will also be used to check for low iron levels (anemia) and protein on red blood cells (Rh antibodies). Depending on your risk factors, or if you previously had diabetes during pregnancy, you may have tests to check for high blood sugar that affects pregnant women (gestational diabetes).  Urine tests to check for infections, diabetes, or protein in the urine.  An ultrasound to confirm the proper growth and development of the baby.  Fetal screens for spinal cord problems (spina bifida) and Down syndrome.  HIV (human immunodeficiency virus) testing. Routine prenatal testing includes screening for HIV, unless you choose not to have this test.  You may need other tests to make sure you and the baby are doing well. Follow these instructions at home: Medicines  Follow your health care provider's instructions regarding medicine use. Specific medicines may be either safe or unsafe to take during pregnancy.  Take a prenatal vitamin that contains at least 600 micrograms (mcg) of folic acid.  If you develop constipation, try taking a stool softener if your health care provider approves. Eating and drinking   Eat a balanced diet that includes fresh fruits and vegetables, whole grains, good sources of protein such as meat, eggs, or tofu, and low-fat dairy. Your health care provider will help you determine the amount of weight gain that is right for you.  Avoid raw meat and uncooked cheese. These carry germs that can cause birth defects in the baby.  Eating four or five small meals rather than three large meals a day may help relieve nausea and vomiting. If you start to feel nauseous, eating a few soda crackers can be helpful. Drinking liquids between meals, instead of during meals, also seems to help ease nausea and vomiting.  Limit foods that are high in fat and processed sugars, such as fried and sweet foods.  To prevent constipation: ? Eat foods that are high in  fiber, such as fresh fruits and vegetables, whole grains, and beans. ? Drink enough fluid to keep your urine clear or pale yellow. Activity  Exercise only as directed by your health care provider. Most women can continue their usual exercise routine during pregnancy. Try to exercise for 30 minutes at least 5 days a week. Exercising will help you: ? Control your weight. ? Stay in shape. ? Be prepared for labor and delivery.  Experiencing pain or cramping in the lower abdomen or lower back is a good sign that you should stop exercising. Check with your health care provider before continuing with normal exercises.  Try to avoid standing for long periods of time. Move your legs often if you must stand in one place for a long time.  Avoid heavy lifting.  Wear low-heeled shoes and practice good posture.  You may continue to have sex unless your health care provider tells you not to. Relieving pain and discomfort  Wear a good support bra to relieve breast tenderness.  Take warm sitz baths to soothe any pain or discomfort caused by hemorrhoids. Use hemorrhoid cream if your health care provider approves.  Rest with your legs elevated if you have leg cramps or low back pain.  If you develop varicose veins in your legs, wear support hose. Elevate your feet for 15 minutes, 3-4 times a day. Limit salt in your diet. Prenatal care  Schedule your prenatal visits by the twelfth week of pregnancy. They are usually scheduled monthly at first, then more often in the last 2 months before delivery.  Write down your questions. Take them to your prenatal visits.  Keep all your prenatal visits as told by your health care provider. This is important. Safety  Wear your seat belt at all times when driving.  Make a list of emergency phone numbers, including numbers for family, friends, the hospital, and police and fire departments. General instructions  Ask your health care provider for a referral to a  local prenatal education class. Begin classes no later than the beginning of month 6 of your pregnancy.  Ask for help if you have counseling or nutritional needs during pregnancy. Your health care provider can offer advice or refer you to specialists for help with various needs.  Do not use hot tubs, steam rooms, or saunas.  Do not douche or use tampons or scented sanitary pads.  Do not cross your legs for long periods of time.  Avoid cat litter boxes and soil used by cats. These carry germs that can cause birth defects in the baby and possibly loss of the fetus by miscarriage or stillbirth.  Avoid all smoking, herbs, alcohol, and medicines not prescribed by your health care provider. Chemicals in these products affect the formation and growth of the baby.  Do not use any products that contain nicotine or tobacco, such as cigarettes and e-cigarettes. If you need help quitting, ask your health care provider. You may receive counseling support and other resources to help you quit.  Schedule a dentist appointment. At home, brush your teeth with a soft toothbrush and be gentle when you floss. Contact a health care provider if:  You have dizziness.  You have mild pelvic cramps, pelvic pressure, or nagging pain in the abdominal area.  You have persistent nausea, vomiting, or diarrhea.  You have a bad smelling vaginal discharge.  You have pain when you urinate.  You notice increased swelling in your face, hands, legs, or ankles.  You are exposed to fifth disease or chickenpox.  You are exposed to [place] measles (rubella) and have never had it. Get help right away if:  You have a fever.  You are leaking fluid from your vagina.  You have spotting or bleeding from your vagina.  You have severe abdominal cramping or pain.  You have rapid weight gain or loss.  You vomit blood or material that looks like coffee grounds.  You develop a severe headache.  You have shortness of  breath.  You have any kind of trauma, such as from a fall or a car accident. Summary  The first trimester of pregnancy is from week 1 until the end of week 13 (months 1 through 3).  Your body goes through many changes during pregnancy. The changes vary from woman to woman.  You will have routine prenatal visits. During those visits, your health care provider will examine you, discuss any test results you may have, and talk with you about how you are feeling. This information is not  intended to replace advice given to you by your health care provider. Make sure you discuss any questions you have with your health care provider. Document Revised: 07/30/2017 Document Reviewed: 07/29/2016 Elsevier Patient Education  2020 [employer].

## 2020-04-08 NOTE — Progress Notes (Signed)
04/08/2020   Chief Complaint: Missed period  Transfer of Care Patient: no  History of Present Illness: Ms. Maselli is a 24 y.o. G1P0000 [redacted]w[redacted]d based on Patient's last menstrual period was 02/16/2020. with an Estimated Date of Delivery: 11/22/20, with the above CC.   Her periods were: regular periods every 28 days She was using no OCPs as she was transitioning from differnet pill when she conceived.  She has Positive signs or symptoms of nausea/vomiting of pregnancy. She has Negative signs or symptoms of miscarriage or preterm labor She identifies Negative Zika risk factors for her and her partner On any different medications around the time she conceived/early pregnancy: No  History of varicella: Yes   ROS: A 12-point review of systems was performed and negative, except as stated in the above HPI.  OBGYN History: As per HPI. OB History  Gravida Para Term Preterm AB Living  1 0 0 0 0 0  SAB TAB Ectopic Multiple Live Births  0 0 0 0 0    # Outcome Date GA Lbr Len/2nd Weight Sex Delivery Anes PTL Lv  1 Current             Any issues with any prior pregnancies: not applicable Any prior children are healthy, doing well, without any problems or issues: not applicable History of pap smears: Yes. Last pap smear > 1 year ago. Abnormal: no History of STIs: No   Past Medical History: Past Medical History:  Diagnosis Date  . Kidney stone     Past Surgical History: Past Surgical History:  Procedure Laterality Date  . MOUTH SURGERY      Family History:  Family History  Problem Relation Age of Onset  . Anxiety disorder Maternal Grandmother   . Cancer Maternal Grandfather   . Diabetes Paternal Grandfather    She denies any female cancers, bleeding or blood clotting disorders.  She denies any history of mental retardation, birth defects or genetic disorders in her or the FOB's history  Social History:  Social History   Socioeconomic History  . Marital status: Single    Spouse  name: Not on file  . Number of children: Not on file  . Years of education: Not on file  . Highest education level: Not on file  Occupational History  . Not on file  Tobacco Use  . Smoking status: Never Smoker  . Smokeless tobacco: Never Used  Substance and Sexual Activity  . Alcohol use: No  . Drug use: No  . Sexual activity: Yes    Partners: Male    Birth control/protection: None  Other Topics Concern  . Not on file  Social History Narrative  . Not on file   Social Determinants of Health   Financial Resource Strain:   . Difficulty of Paying Living Expenses:   Food Insecurity:   . Worried About Programme researcher, broadcasting/film/video in the Last Year:   . Barista in the Last Year:   Transportation Needs:   . Freight forwarder (Medical):   Marland Kitchen Lack of Transportation (Non-Medical):   Physical Activity:   . Days of Exercise per Week:   . Minutes of Exercise per Session:   Stress:   . Feeling of Stress :   Social Connections:   . Frequency of Communication with Friends and Family:   . Frequency of Social Gatherings with Friends and Family:   . Attends Religious Services:   . Active Member of Clubs or Organizations:   .  Attends Banker Meetings:   Marland Kitchen Marital Status:   Intimate Partner Violence:   . Fear of Current or Ex-Partner:   . Emotionally Abused:   Marland Kitchen Physically Abused:   . Sexually Abused:    Any pets in the household: no  Allergy: No Known Allergies  Current Outpatient Medications:  Current Outpatient Medications:  .  bismuth subsalicylate (PEPTO BISMOL) 262 MG/15ML suspension, Take 10 mLs by mouth every 6 (six) hours as needed for indigestion. (Patient not taking: Reported on 04/08/2020), Disp: , Rfl:  .  dicyclomine (BENTYL) 20 MG tablet, Take 1 tablet (20 mg total) by mouth 2 (two) times daily. (Patient not taking: Reported on 04/08/2020), Disp: 20 tablet, Rfl: 0 .  metroNIDAZOLE (FLAGYL) 500 MG tablet, Take 1 tablet (500 mg total) by mouth 2 (two) times  daily. (Patient not taking: Reported on 04/08/2020), Disp: 14 tablet, Rfl: 0 .  naproxen sodium (ALEVE) 220 MG tablet, Take 220 mg by mouth daily as needed (pain). (Patient not taking: Reported on 04/08/2020), Disp: , Rfl:  .  ondansetron (ZOFRAN ODT) 4 MG disintegrating tablet, Take 1 tablet (4 mg total) by mouth every 8 (eight) hours as needed for nausea or vomiting. (Patient not taking: Reported on 04/08/2020), Disp: 20 tablet, Rfl: 0 .  promethazine (PHENERGAN) 25 MG tablet, Take 1 tablet (25 mg total) by mouth every 6 (six) hours as needed for nausea or vomiting. And at night to help w insomnia., Disp: 30 tablet, Rfl: 2   Physical Exam:   BP 120/80   Wt 190 lb (86.2 kg)   LMP 02/16/2020   BMI 32.61 kg/m  Body mass index is 32.61 kg/m. Constitutional: Well nourished, well developed female in no acute distress.  Neck:  Supple, normal appearance, and no thyromegaly  Cardiovascular: S1, S2 normal, no murmur, rub or gallop, regular rate and rhythm Respiratory:  Clear to auscultation bilateral. Normal respiratory effort Abdomen: positive bowel sounds and no masses, hernias; diffusely non tender to palpation, non distended Breasts: breasts appear normal, no suspicious masses, no skin or nipple changes or axillary nodes. Neuro/Psych:  Normal mood and affect.  Skin:  Warm and dry.  Lymphatic:  No inguinal lymphadenopathy.   Pelvic exam: is not limited by body habitus EGBUS: within normal limits, Vagina: within normal limits and with no blood in the vault, Cervix: normal appearing cervix without discharge or lesions, closed/long/high, Uterus:  enlarged: 8 wks, and Adnexa:  normal adnexa  Assessment: Ms. Cashin is a 24 y.o. G1P0000 [redacted]w[redacted]d based on Patient's last menstrual period was 02/16/2020. with an Estimated Date of Delivery: 11/22/20,  for prenatal care.  Plan:  1) Avoid alcoholic beverages. 2) Patient encouraged not to smoke.  3) Discontinue the use of all non-medicinal drugs and chemicals.    4) Take prenatal vitamins daily.  5) Seatbelt use advised 6) Nutrition, food safety (fish, cheese advisories, and high nitrite foods) and exercise discussed. 7) Hospital and practice style delivering at [facility] discussed  8) Patient is asked about travel to areas at risk for the Zika virus, and counseled to avoid travel and exposure to mosquitoes or sexual partners who may have themselves been exposed to the virus. Testing is discussed, and will be ordered as appropriate.  9) Childbirth classes at [facility] advised 10) Genetic Screening, such as with 1st Trimester Screening, cell free fetal DNA, AFP testing, and Ultrasound, as well as with amniocentesis and CVS as appropriate, is discussed with patient. She plans to have genetic testing this pregnancy.  11) [place] soon, NIPT >10 weeks 12) Phenergan for nausea/insomnia  Problem list reviewed and updated.  Annamarie Major, MD, [facility], [facility] 04/08/2020  3:31 PM

## 2020-04-09 LAB — RPR+RH+ABO+RUB AB+AB SCR+CB...
Antibody Screen: NEGATIVE
HIV Screen 4th Generation wRfx: NONREACTIVE
Hematocrit: 32 % — ABNORMAL LOW (ref 34.0–46.6)
Hemoglobin: 11.4 g/dL (ref 11.1–15.9)
Hepatitis B Surface Ag: NEGATIVE
MCH: 31.7 pg (ref 26.6–33.0)
MCHC: 35.6 g/dL (ref 31.5–35.7)
MCV: 89 fL (ref 79–97)
Platelets: 238 10*3/uL (ref 150–450)
RBC: 3.6 x10E6/uL — ABNORMAL LOW (ref 3.77–5.28)
RDW: 12.6 % (ref 11.7–15.4)
RPR Ser Ql: NONREACTIVE
Rh Factor: POSITIVE
Rubella Antibodies, IGG: 16.2 index (ref >0.99)
Varicella zoster IgG: 2844 index (ref >165)
WBC: 9.4 10*3/uL (ref 3.4–10.8)

## 2020-04-10 LAB — CYTOLOGY - PAP
Chlamydia: NEGATIVE
Comment: NEGATIVE
Comment: NEGATIVE
Comment: NORMAL
Diagnosis: NEGATIVE
Neisseria Gonorrhea: NEGATIVE
Trichomonas: NEGATIVE

## 2020-04-10 LAB — URINE CULTURE

## 2020-04-17 ENCOUNTER — Other Ambulatory Visit: Payer: Self-pay

## 2020-04-17 ENCOUNTER — Ambulatory Visit (INDEPENDENT_AMBULATORY_CARE_PROVIDER_SITE_OTHER): Payer: Self-pay

## 2020-04-17 ENCOUNTER — Ambulatory Visit (INDEPENDENT_AMBULATORY_CARE_PROVIDER_SITE_OTHER): Payer: 59 | Admitting: Obstetrics

## 2020-04-17 ENCOUNTER — Other Ambulatory Visit: Payer: Self-pay | Admitting: Obstetrics & Gynecology

## 2020-04-17 VITALS — BP 116/60 | Wt 191.0 lb

## 2020-04-17 DIAGNOSIS — Z3480 Encounter for supervision of other normal pregnancy, unspecified trimester: Secondary | ICD-10-CM | POA: Insufficient documentation

## 2020-04-17 DIAGNOSIS — Z369 Encounter for antenatal screening, unspecified: Secondary | ICD-10-CM

## 2020-04-17 DIAGNOSIS — Z3A09 9 weeks gestation of pregnancy: Secondary | ICD-10-CM

## 2020-04-17 DIAGNOSIS — Z3A08 8 weeks gestation of pregnancy: Secondary | ICD-10-CM

## 2020-04-17 DIAGNOSIS — Z3491 Encounter for supervision of normal pregnancy, unspecified, first trimester: Secondary | ICD-10-CM

## 2020-04-17 NOTE — Progress Notes (Signed)
  Routine Prenatal Care Visit  Subjective  Monique Barnett is a 24 y.o. G1P0000 at [redacted]w[redacted]d being seen today for ongoing prenatal care.  She is currently monitored for the following issues for this low-risk pregnancy and has Supervision of low-risk pregnancy, first trimester and Supervision of other normal pregnancy, antepartum on their problem list.  ----------------------------------------------------------------------------------- Patient reports no bleeding, no contractions, no cramping and no leaking.  Her husband is with her and they have just completed their dating ultrasound, which supports her LMP  .  .   . Leaking Fluid denies.  ----------------------------------------------------------------------------------- The following portions of the patient's history were reviewed and updated as appropriate: allergies, current medications, past family history, past medical history, past social history, past surgical history and problem list. Problem list updated.  Objective  Blood pressure 116/60, weight 191 lb (86.6 kg), last menstrual period 02/16/2020. Pregravid weight 175 lb (79.4 kg) Total Weight Gain 16 lb (7.258 kg) Urinalysis: Urine Protein    Urine Glucose    Fetal Status:           General:  Alert, oriented and cooperative. Patient is in no acute distress.  Skin: Skin is warm and dry. No rash noted.   Cardiovascular: Normal heart rate noted  Respiratory: Normal respiratory effort, no problems with respiration noted  Abdomen: Soft, gravid, appropriate for gestational age.       Pelvic:  Cervical exam deferred        Extremities: Normal range of motion.     Mental Status: Normal mood and affect. Normal behavior. Normal judgment and thought content.   Assessment   24 y.o. G1P0000 at [redacted]w[redacted]d by  11/22/2020, by Last Menstrual Period presenting for routine prenatal visit  Plan   pregnancy1  Problems (from 02/16/20 to present)    No problems associated with this episode.        Preterm labor symptoms and general obstetric precautions including but not limited to vaginal bleeding, contractions, leaking of fluid and fetal movement were reviewed in detail with the patient. Please refer to After Visit Summary for other counseling recommendations.  Discussed fetal testing today, and they plan on the MaternT test. Encouraged to take a virtual CBE class and learn about breastfeeding.  Return in about 3 weeks (around 05/08/2020) for return OB and MaternT testing.Mirna Mires, CNM  04/17/2020 12:46 PM

## 2020-04-17 NOTE — Progress Notes (Signed)
ROB Dating scan 

## 2020-05-08 ENCOUNTER — Encounter: Payer: Self-pay | Admitting: Advanced Practice Midwife

## 2020-05-08 ENCOUNTER — Ambulatory Visit (INDEPENDENT_AMBULATORY_CARE_PROVIDER_SITE_OTHER): Payer: Self-pay | Admitting: Advanced Practice Midwife

## 2020-05-08 ENCOUNTER — Other Ambulatory Visit: Payer: Self-pay

## 2020-05-08 DIAGNOSIS — Z3491 Encounter for supervision of normal pregnancy, unspecified, first trimester: Secondary | ICD-10-CM

## 2020-05-08 DIAGNOSIS — Z3A08 8 weeks gestation of pregnancy: Secondary | ICD-10-CM

## 2020-05-08 LAB — POCT URINALYSIS DIPSTICK OB
Glucose, UA: NEGATIVE
POC,PROTEIN,UA: NEGATIVE

## 2020-05-08 NOTE — Patient Instructions (Signed)

## 2020-05-08 NOTE — Progress Notes (Signed)
  Routine Prenatal Care Visit  Subjective  Monique Barnett is a 24 y.o. G1P0000 at [redacted]w[redacted]d being seen today for ongoing prenatal care.  She is currently monitored for the following issues for this low-risk pregnancy and has Supervision of low-risk pregnancy, first trimester and Supervision of other normal pregnancy, antepartum on their problem list.  ----------------------------------------------------------------------------------- Patient reports no complaints.    . Vag. Bleeding: None.   . Leaking Fluid denies.  ----------------------------------------------------------------------------------- The following portions of the patient's history were reviewed and updated as appropriate: allergies, current medications, past family history, past medical history, past social history, past surgical history and problem list. Problem list updated.  Objective  Blood pressure 112/70, weight 194 lb 9.6 oz (88.3 kg), last menstrual period 02/16/2020. Pregravid weight 175 lb (79.4 kg) Total Weight Gain 19 lb 9.6 oz (8.891 kg) Urinalysis: Urine Protein Negative  Urine Glucose Negative  Fetal Status: Fetal Heart Rate (bpm): 160         General:  Alert, oriented and cooperative. Patient is in no acute distress.  Skin: Skin is warm and dry. No rash noted.   Cardiovascular: Normal heart rate noted  Respiratory: Normal respiratory effort, no problems with respiration noted  Abdomen: Soft, gravid, appropriate for gestational age.       Pelvic:  Cervical exam deferred        Extremities: Normal range of motion.     Mental Status: Normal mood and affect. Normal behavior. Normal judgment and thought content.   Assessment   24 y.o. G1P0000 at [redacted]w[redacted]d by  11/22/2020, by Last Menstrual Period presenting for routine prenatal visit  Plan  MaterniT 21 today   Preterm labor symptoms and general obstetric precautions including but not limited to vaginal bleeding, contractions, leaking of fluid and fetal movement were  reviewed in detail with the patient. Please refer to After Visit Summary for other counseling recommendations.   Return in about 4 weeks (around 06/05/2020) for rob.  Tresea Mall, CNM 05/08/2020 4:51 PM

## 2020-05-12 LAB — MATERNIT 21 PLUS CORE, BLOOD
Fetal Fraction: 7
Result (T21): NEGATIVE
Trisomy 13 (Patau syndrome): NEGATIVE
Trisomy 18 (Edwards syndrome): NEGATIVE
Trisomy 21 (Down syndrome): NEGATIVE

## 2020-06-05 ENCOUNTER — Encounter: Payer: 59 | Admitting: Obstetrics & Gynecology

## 2020-06-10 ENCOUNTER — Ambulatory Visit (INDEPENDENT_AMBULATORY_CARE_PROVIDER_SITE_OTHER): Payer: 59 | Admitting: Obstetrics

## 2020-06-10 ENCOUNTER — Other Ambulatory Visit: Payer: Self-pay

## 2020-06-10 VITALS — BP 124/60 | Wt 196.0 lb

## 2020-06-10 DIAGNOSIS — Z3491 Encounter for supervision of normal pregnancy, unspecified, first trimester: Secondary | ICD-10-CM

## 2020-06-10 DIAGNOSIS — Z3A16 16 weeks gestation of pregnancy: Secondary | ICD-10-CM

## 2020-06-10 LAB — POCT URINALYSIS DIPSTICK OB
Glucose, UA: NEGATIVE
POC,PROTEIN,UA: NEGATIVE

## 2020-06-10 NOTE — Progress Notes (Signed)
C/o tearful d/t hard day at work.  Would like rx for prenatal vitamins - has otc pnv but they don't have folic acid and she feels they are not working d/t tiredness.rj

## 2020-06-10 NOTE — Progress Notes (Signed)
  Routine Prenatal Care Visit  Subjective  Monique Barnett is a 24 y.o. G1P0000 at [redacted]w[redacted]d being seen today for ongoing prenatal care.  She is currently monitored for the following issues for this low-risk pregnancy and has Supervision of low-risk pregnancy, first trimester on their problem list.  ----------------------------------------------------------------------------------- Patient reports no complaints.    .  .   Pincus Large Fluid denies.  ----------------------------------------------------------------------------------- The following portions of the patient's history were reviewed and updated as appropriate: allergies, current medications, past family history, past medical history, past social history, past surgical history and problem list. Problem list updated.  Objective  Blood pressure 124/60, weight 196 lb (88.9 kg), last menstrual period 02/16/2020. Pregravid weight 180 lb (81.6 kg) Total Weight Gain 16 lb (7.258 kg) Urinalysis: Urine Protein Negative  Urine Glucose Negative  Fetal Status:           General:  Alert, oriented and cooperative. Patient is in no acute distress.  Skin: Skin is warm and dry. No rash noted.   Cardiovascular: Normal heart rate noted  Respiratory: Normal respiratory effort, no problems with respiration noted  Abdomen: Soft, gravid, appropriate for gestational age.       Pelvic:  Cervical exam deferred        Extremities: Normal range of motion.     Mental Status: Normal mood and affect. Normal behavior. Normal judgment and thought content.   Assessment   24 y.o. G1P0000 at [redacted]w[redacted]d by  11/22/2020, by Last Menstrual Period presenting for routine prenatal visit  Plan   pregnancy1  Problems (from 02/16/20 to present)    Problem Noted Resolved   Supervision of low-risk pregnancy, first trimester 04/08/2020 by Nadara Mustard, MD No   Overview Addendum 06/10/2020  5:00 PM by Mirna Mires, CNM    Clinic [facility] Prenatal Labs  Dating  Blood type:  O/Positive/-- (08/09 1536)   Genetic Screen 1 Screen:    AFP:     Quad:     NIPS: Antibody:Negative (08/09 1536)  Anatomic [place]  Rubella: 16.20 (08/09 1536)  Varicella: @VZVIGG @  GTT Early:               Third trimester:  RPR: Non Reactive (08/09 1536)   Rhogam  HBsAg: Negative (08/09 1536)   Vaccines TDAP:                       Flu Shot: Covid: HIV: Non Reactive (08/09 1536)   Baby Food                                GBS:   Contraception  10-19-1975  CBB     CS/VBAC    Support Person           Previous Version       Preterm labor symptoms and general obstetric precautions including but not limited to vaginal bleeding, contractions, leaking of fluid and fetal movement were reviewed in detail with the patient. Please refer to After Visit Summary for other counseling recommendations.  She works retainl- feeling tired- maternity belt advised. Change from high heals to flats or short heels.  No follow-ups on file.  DJM:EQAS, CNM  06/10/2020 5:01 PM

## 2020-07-08 ENCOUNTER — Ambulatory Visit (INDEPENDENT_AMBULATORY_CARE_PROVIDER_SITE_OTHER): Payer: 59

## 2020-07-08 ENCOUNTER — Other Ambulatory Visit: Payer: Self-pay

## 2020-07-08 ENCOUNTER — Ambulatory Visit (INDEPENDENT_AMBULATORY_CARE_PROVIDER_SITE_OTHER): Payer: 59 | Admitting: Obstetrics

## 2020-07-08 VITALS — BP 120/56 | Wt 201.0 lb

## 2020-07-08 DIAGNOSIS — Z3491 Encounter for supervision of normal pregnancy, unspecified, first trimester: Secondary | ICD-10-CM

## 2020-07-08 DIAGNOSIS — Z3480 Encounter for supervision of other normal pregnancy, unspecified trimester: Secondary | ICD-10-CM

## 2020-07-08 DIAGNOSIS — Z3A21 21 weeks gestation of pregnancy: Secondary | ICD-10-CM

## 2020-07-08 DIAGNOSIS — Z3A20 20 weeks gestation of pregnancy: Secondary | ICD-10-CM

## 2020-07-08 NOTE — Progress Notes (Signed)
No concerns.rj 

## 2020-07-08 NOTE — Progress Notes (Signed)
  Routine Prenatal Care Visit  Subjective  Monique Barnett is a 24 y.o. G1P0000 at [redacted]w[redacted]d being seen today for ongoing prenatal care.  She is currently monitored for the following issues for this low-risk pregnancy and has Supervision of low-risk pregnancy, first trimester on their problem list.  ----------------------------------------------------------------------------------- Patient reports no complaints.    .  .   Pincus Large Fluid denies.  ----------------------------------------------------------------------------------- The following portions of the patient's history were reviewed and updated as appropriate: allergies, current medications, past family history, past medical history, past social history, past surgical history and problem list. Problem list updated.  Objective  Blood pressure (!) 120/56, weight 201 lb (91.2 kg), last menstrual period 02/16/2020. Pregravid weight 180 lb (81.6 kg) Total Weight Gain 21 lb (9.526 kg) Urinalysis: Urine Protein    Urine Glucose    Fetal Status:           General:  Alert, oriented and cooperative. Patient is in no acute distress.  Skin: Skin is warm and dry. No rash noted.   Cardiovascular: Normal heart rate noted  Respiratory: Normal respiratory effort, no problems with respiration noted  Abdomen: Soft, gravid, appropriate for gestational age.       Pelvic:  Cervical exam deferred        Extremities: Normal range of motion.     Mental Status: Normal mood and affect. Normal behavior. Normal judgment and thought content.   Assessment   24 y.o. G1P0000 at [redacted]w[redacted]d by  11/22/2020, by Last Menstrual Period presenting for routine prenatal visit  Plan   pregnancy1  Problems (from 02/16/20 to present)    Problem Noted Resolved   Supervision of low-risk pregnancy, first trimester 04/08/2020 by Nadara Mustard, MD No   Overview Addendum 07/08/2020 11:03 AM by Mirna Mires, CNM    [facility]inic [facility] Prenatal Labs  Dating  Blood type: O/Positive/--  (08/09 1536)   Genetic Screen 1 Screen:    AFP:     Quad:     NIPS: Antibody:Negative (08/09 1536)  Anatomic [place] 07/08/20 Rubella: 16.20 (08/09 1536)  Varicella: @VZVIGG @  GTT Early:               Third trimester:  RPR: Non Reactive (08/09 1536)   Rhogam  HBsAg: Negative (08/09 1536)   Vaccines TDAP:                       Flu Shot: Covid: HIV: Non Reactive (08/09 1536)   Baby Food                                GBS:   Contraception  10[birth year]19-1974  CBB     CS/VBAC    Support Person           Previous Version       Preterm labor symptoms and general obstetric precautions including but not limited to vaginal bleeding, contractions, leaking of fluid and fetal movement were reviewed in detail with the patient. Please refer to After Visit Summary for other counseling recommendations.  Reviewed her growth scan today. She is having a boy. "Josua"  No follow-ups on file.  BOF:BPZW, CNM  07/08/2020 11:10 AM

## 2020-07-15 ENCOUNTER — Telehealth: Payer: Self-pay

## 2020-07-15 NOTE — Telephone Encounter (Signed)
Let's set up an additional appointment for her at the office. She needs an [place] score and we can also make a referral. I would be comfortable seeing her; I think she would respond to medication, but we need to assess her first.

## 2020-07-15 NOTE — Telephone Encounter (Signed)
Spoke w/patient. No antidepressants seen on meds list. Inquired if this is something new. She advised that it may just be hormones, it started with just having one bad day here or there where she just cries all day. It has become more frequent and family had suggested maybe she should talk to someone to try to figure out why she's feeling this way so often.

## 2020-07-15 NOTE — Telephone Encounter (Signed)
Patient inquiring if we offer any recommendations for therapy for pregnant patients going thru depression. DZ#[phone]

## 2020-07-16 NOTE — Telephone Encounter (Signed)
LMVM TRC to schedule apt for eval and can refer accordingly per MMF.

## 2020-07-16 NOTE — Telephone Encounter (Signed)
Called and left generic message for patient to call back to confirm scheduled appointment with MMF.

## 2020-07-19 ENCOUNTER — Ambulatory Visit (INDEPENDENT_AMBULATORY_CARE_PROVIDER_SITE_OTHER): Payer: 59 | Admitting: Obstetrics

## 2020-07-19 ENCOUNTER — Other Ambulatory Visit: Payer: Self-pay

## 2020-07-19 VITALS — BP 120/60 | Wt 206.0 lb

## 2020-07-19 DIAGNOSIS — F4321 Adjustment disorder with depressed mood: Secondary | ICD-10-CM | POA: Insufficient documentation

## 2020-07-19 DIAGNOSIS — Z3A22 22 weeks gestation of pregnancy: Secondary | ICD-10-CM

## 2020-07-19 DIAGNOSIS — Z3480 Encounter for supervision of other normal pregnancy, unspecified trimester: Secondary | ICD-10-CM

## 2020-07-19 LAB — POCT URINALYSIS DIPSTICK OB
Glucose, UA: NEGATIVE
POC,PROTEIN,UA: NEGATIVE

## 2020-07-19 MED ORDER — SERTRALINE HCL 50 MG PO TABS: 50.0000 mg | ORAL_TABLET | Freq: Every day | ORAL | 2 refills | Status: DC

## 2020-07-19 NOTE — Progress Notes (Signed)
Routine Prenatal Care Visit  Subjective  Monique Barnett is a 24 y.o. G1P0000 at [redacted]w[redacted]d being seen today for ongoing prenatal care.  She is currently monitored for the following issues for this low-risk pregnancy and has Supervision of low-risk pregnancy, first trimester on their problem list.  ----------------------------------------------------------------------------------- Patient reports no bleeding, no contractions, no cramping and but is worked in today for [employer] of some mood irritability. she is tearful during the interview. Shares that the relationship between her and her BF has been full of conflict: She gave usp her apartment to .iv with him and his family, and it has been a bad experience. In addition , the FOB has another child and that mother has been very unkind to [name]. [name]'s mother has provided her with a new place to live; the FOB wants to do couple's counseling, but [name] is feeling overwhelmed by the interpersonal challenges. She is asking for some medication .    .  .   . Leaking Fluid denies.  ----------------------------------------------------------------------------------- The following portions of the patient's history were reviewed and updated as appropriate: allergies, current medications, past family history, past medical history, past social history, past surgical history and problem list. Problem list updated.  Objective  Blood pressure 120/60, weight 206 lb (93.4 kg), last menstrual period 02/16/2020. Pregravid weight 180 lb (81.6 kg) Total Weight Gain 26 lb (11.8 kg) Urinalysis: Urine Protein Negative  Urine Glucose Negative  Fetal Status:           General:  Alert, oriented and cooperative. Patient is in no acute distress.  Skin: Skin is warm and dry. No rash noted.   Cardiovascular: Normal heart rate noted  Respiratory: Normal respiratory effort, no problems with respiration noted  Abdomen: Soft, gravid, appropriate for gestational age.         Pelvic:  Cervical exam deferred        Extremities: Normal range of motion.  Edema: None  Mental Status: Normal mood and affect. Normal behavior. Normal judgment and thought content.   Assessment   24 y.o. G1P0000 at [redacted]w[redacted]d by  11/22/2020, by Last Menstrual Period presenting for work-in prenatal visit  Plan   pregnancy1  Problems (from 02/16/20 to present)    Problem Noted Resolved   Supervision of low-risk pregnancy, first trimester 04/08/2020 by Nadara Mustard, MD No   Overview Addendum 07/12/2020  2:39 PM by Mirna Mires, CNM    [facility]inic [facility] Prenatal Labs  Dating  Blood type: O/Positive/-- (08/09 1536)   Genetic Screen 1 Screen:    AFP:     Quad:     NIPS: Antibody:Negative (08/09 1536)  Anatomic [place] 07/08/20-incomplete repeat 2 weeks. Renal atalectasis seen- rescan at 28 wks Rubella: 16.20 (08/09 1536)  Varicella: @VZVIGG @  GTT Early:               Third trimester:  RPR: Non Reactive (08/09 1536)   Rhogam  HBsAg: Negative (08/09 1536)   Vaccines TDAP:                       Flu Shot: Covid: HIV: Non Reactive (08/09 1536)   Baby Food                                GBS:   Contraception  10-19-1974  CBB     CS/VBAC    Support Person  Previous Version       Preterm labor symptoms and general obstetric precautions including but not limited to vaginal bleeding, contractions, leaking of fluid and fetal movement were reviewed in detail with the patient. Please refer to After Visit Summary for other counseling recommendations.  Her [place] score is a 19 today. She denies any suicidal ideations during our visit. She is reaching out through her mother's insurance for some counseling. Se discussed the importance of adequate sleep, sunlight exposure, walking or other exercise, and will trial her on some Zoloft 50 mg. The SEs of SSRIs are reviewed with her, and she is advised to take her medication every day, and not stop it abruptly.    Return in about 1 week (around  07/26/2020) for return OB.  Mirna Mires, CNM  07/19/2020 5:22 PM

## 2020-07-19 NOTE — Progress Notes (Signed)
C/o depression - crying - situational per pt.rj

## 2020-08-05 ENCOUNTER — Ambulatory Visit (INDEPENDENT_AMBULATORY_CARE_PROVIDER_SITE_OTHER): Payer: 59 | Admitting: Obstetrics

## 2020-08-05 ENCOUNTER — Other Ambulatory Visit: Payer: Self-pay

## 2020-08-05 VITALS — BP 114/60 | Wt 208.0 lb

## 2020-08-05 DIAGNOSIS — Z3A24 24 weeks gestation of pregnancy: Secondary | ICD-10-CM

## 2020-08-05 DIAGNOSIS — Z3480 Encounter for supervision of other normal pregnancy, unspecified trimester: Secondary | ICD-10-CM

## 2020-08-05 DIAGNOSIS — F4321 Adjustment disorder with depressed mood: Secondary | ICD-10-CM

## 2020-08-05 LAB — POCT URINALYSIS DIPSTICK OB
Glucose, UA: NEGATIVE
POC,PROTEIN,UA: NEGATIVE

## 2020-08-05 NOTE — Progress Notes (Signed)
Routine Prenatal Care Visit  Subjective  Monique Barnett is a 24 y.o. G1P0000 at [redacted]w[redacted]d being seen today for ongoing prenatal care.  She is currently monitored for the following issues for this low-risk pregnancy and has Supervision of low-risk pregnancy, first trimester and Situational depression on their problem list.  ----------------------------------------------------------------------------------- Patient reports no complaints.  She is feeling much less depressed since starting on Zoloft. Not crying as she had been before. Takes the medication at night. Has moved into an apt. Her mother is subsidizing.  .  .   Pincus Large Fluid denies.  ----------------------------------------------------------------------------------- The following portions of the patient's history were reviewed and updated as appropriate: allergies, current medications, past family history, past medical history, past social history, past surgical history and problem list. Problem list updated.  Objective  Blood pressure 114/60, weight 208 lb (94.3 kg), last menstrual period 02/16/2020. Pregravid weight 180 lb (81.6 kg) Total Weight Gain 28 lb (12.7 kg) Urinalysis: Urine Protein    Urine Glucose    Fetal Status:           General:  Alert, oriented and cooperative. Patient is in no acute distress.  Skin: Skin is warm and dry. No rash noted.   Cardiovascular: Normal heart rate noted  Respiratory: Normal respiratory effort, no problems with respiration noted  Abdomen: Soft, gravid, appropriate for gestational age.       Pelvic:  Cervical exam deferred        Extremities: Normal range of motion.     Mental Status: Normal mood and affect. Normal behavior. Normal judgment and thought content.   Assessment   24 y.o. G1P0000 at [redacted]w[redacted]d by  11/22/2020, by Last Menstrual Period presenting for routine prenatal visit  Plan   pregnancy1  Problems (from 02/16/20 to present)    Problem Noted Resolved   Situational depression  07/19/2020 by Mirna Mires, CNM No   Overview Signed 07/19/2020  5:35 PM by Mirna Mires, CNM    Started on Zoloft 50 mg daily 07/19/2020 due to situational stress.      Supervision of low-risk pregnancy, first trimester 04/08/2020 by Nadara Mustard, MD No   Overview Addendum 08/05/2020  8:16 AM by Mirna Mires, CNM    Clinic [facility] Prenatal Labs  Dating  CWLMP Blood type: O/Positive/-- (08/09 1536)   Genetic Screen 1 CWScreen:    AFP:     Quad:     NIPS: Antibody:Negative (08/09 1536)  Anatomic [place] 07/08/20-incomplete repeat 2 weeks. Renal atalectasis seen- rescan at 28 wks Rubella: 16.20 (08/09 1536)  Varicella: @VZVIGG @  GTT  RPR: Non Reactive (08/09 1536)   Rhogam   NA HBsAg: Negative (08/09 1536)   Vaccines TDAP:                       Flu Shot: Covid: HIV: Non Reactive (08/09 1536)   Baby Food                                GBS:   Contraception  10-19-1975  CBB     CS/VBAC    Support Person           Previous Version       Preterm labor symptoms and general obstetric precautions including but not limited to vaginal bleeding, contractions, leaking of fluid and fetal movement were reviewed in detail with the patient. Please refer to After Visit Summary for  other counseling recommendations.  Discussed the 1hr GTT in 4 weeks. She will need a repeat sono to ck fetal kidneys at 28 weeks.  Return in about 2 weeks (around 08/19/2020) for return OB.  Mirna Mires, CNM  08/05/2020 8:19 AM

## 2020-08-05 NOTE — Progress Notes (Signed)
No concerns.rj 

## 2020-08-19 ENCOUNTER — Ambulatory Visit (INDEPENDENT_AMBULATORY_CARE_PROVIDER_SITE_OTHER): Payer: 59 | Admitting: Obstetrics

## 2020-08-19 ENCOUNTER — Encounter: Payer: Self-pay | Admitting: Obstetrics

## 2020-08-19 ENCOUNTER — Other Ambulatory Visit: Payer: Self-pay

## 2020-08-19 VITALS — BP 120/60 | Wt 209.0 lb

## 2020-08-19 DIAGNOSIS — O26849 Uterine size-date discrepancy, unspecified trimester: Secondary | ICD-10-CM

## 2020-08-19 DIAGNOSIS — Z3A28 28 weeks gestation of pregnancy: Secondary | ICD-10-CM

## 2020-08-19 DIAGNOSIS — Z3480 Encounter for supervision of other normal pregnancy, unspecified trimester: Secondary | ICD-10-CM

## 2020-08-19 DIAGNOSIS — Z3A26 26 weeks gestation of pregnancy: Secondary | ICD-10-CM

## 2020-08-19 DIAGNOSIS — Z3491 Encounter for supervision of normal pregnancy, unspecified, first trimester: Secondary | ICD-10-CM

## 2020-08-19 LAB — POCT URINALYSIS DIPSTICK OB
Glucose, UA: NEGATIVE
POC,PROTEIN,UA: NEGATIVE

## 2020-08-19 NOTE — Progress Notes (Addendum)
Routine Prenatal Care Visit  Subjective  Monique Barnett is a 24 y.o. G1P0000 at [redacted]w[redacted]d being seen today for ongoing prenatal care.  She is currently monitored for the following issues for this low-risk pregnancy and has Supervision of low-risk pregnancy, first trimester and Situational depression on their problem list.  ----------------------------------------------------------------------------------- Patient reports no complaints.  Feeling much better since moving into a new apartment and being on Zoloft.   .  .   Pincus Large Fluid denies.  ----------------------------------------------------------------------------------- The following portions of the patient's history were reviewed and updated as appropriate: allergies, current medications, past family history, past medical history, past social history, past surgical history and problem list. Problem list updated.  Objective  Blood pressure 120/60, weight 209 lb (94.8 kg), last menstrual period 02/16/2020, unknown if currently breastfeeding. Pregravid weight 180 lb (81.6 kg) Total Weight Gain 29 lb (13.2 kg) Urinalysis: Urine Protein Negative  Urine Glucose Negative  Fetal Status:           General:  Alert, oriented and cooperative. Patient is in no acute distress.  Skin: Skin is warm and dry. No rash noted.   Cardiovascular: Normal heart rate noted  Respiratory: Normal respiratory effort, no problems with respiration noted  Abdomen: Soft, gravid, appropriate for gestational age.       Pelvic:  Cervical exam deferred        Extremities: Normal range of motion.     Mental Status: Normal mood and affect. Normal behavior. Normal judgment and thought content.   Assessment   24 y.o. G1P0000 at [redacted]w[redacted]d by  11/22/2020, by Last Menstrual Period presenting for routine prenatal visit  Plan   pregnancy1  Problems (from 02/16/20 to present)    Problem Noted Resolved   Situational depression 07/19/2020 by Mirna Mires, CNM No   Overview  Signed 07/19/2020  5:35 PM by Mirna Mires, CNM    Started on Zoloft 50 mg daily 07/19/2020 due to situational stress.      Supervision of low-risk pregnancy, first trimester 04/08/2020 by Nadara Mustard, MD No   Overview Addendum 08/05/2020  8:16 AM by Mirna Mires, CNM    Clinic [facility] Prenatal Labs  Dating  CWLMP Blood type: O/Positive/-- (08/09 1536)   Genetic Screen 1 CWScreen:    AFP:     Quad:     NIPS: Antibody:Negative (08/09 1536)  Anatomic [place] 07/08/20-incomplete repeat 2 weeks. Renal atalectasis seen- rescan at 28 wks Rubella: 16.20 (08/09 1536)  Varicella: @VZVIGG @  GTT  RPR: Non Reactive (08/09 1536)   Rhogam   NA HBsAg: Negative (08/09 1536)   Vaccines TDAP:                       Flu Shot: Covid: HIV: Non Reactive (08/09 1536)   Baby Food                                GBS:   Contraception  10-19-1974  CBB     CS/VBAC    Support Person           Previous Version       Preterm labor symptoms and general obstetric precautions including but not limited to vaginal bleeding, contractions, leaking of fluid and fetal movement were reviewed in detail with the patient. Please refer to After Visit Summary for other counseling recommendations.  Measuring larger for dates and her BMI is 30- growth scan  ordered. 1hr GTT next visit. Earlier anatomy scan noted bilateral renal pyelectasis. Will be sure ultrasound includes viewing kidneys.  Return in about 2 weeks (around 09/02/2020) for return OB, GTT.  Mirna Mires, CNM  08/19/2020 11:02 AM

## 2020-08-19 NOTE — Progress Notes (Signed)
No concerns.rj 

## 2020-08-31 NOTE — L&D Delivery Note (Signed)
Delivery Note At 7:27 AM a viable female was delivered via Vaginal, Spontaneous (Presentation: Right Occiput Anterior).  APGAR: 6, 7; weight 6 lb 15.1 oz (3150 g).   Placenta status: Spontaneous, Intact.  Cord: 3 vessels with the following complications: None.  Cord pH: n/a  Anesthesia: Epidural Episiotomy: None Lacerations: 2nd degree;Vaginal Suture Repair: 3.0 vicryl Est. Blood Loss (mL):  300  Mom to postpartum.  Baby to Couplet care / Skin to Skin.  Called to see patient.  Mom pushed to deliver a viable female infant.  The head followed by shoulders, which delivered without difficulty, and the rest of the body.  A single, loose nuchal cord noted and reduced.  Baby to mom's chest.  Cord clamped and cut after > 1 min delay.  Cord blood obtained.  Placenta delivered spontaneously, intact, with a 3-vessel cord.  Second degree vaginal laceration repaired with 3-0 Vicryl in standard fashion.  All counts correct.  Hemostasis obtained with IV pitocin and fundal massage. EBL 300  mL.     Thomasene Mohair, MD 11/14/2020, 8:04 AM

## 2020-09-04 ENCOUNTER — Other Ambulatory Visit: Payer: 59

## 2020-09-04 ENCOUNTER — Ambulatory Visit (INDEPENDENT_AMBULATORY_CARE_PROVIDER_SITE_OTHER): Payer: Self-pay

## 2020-09-04 ENCOUNTER — Other Ambulatory Visit: Payer: Self-pay

## 2020-09-04 ENCOUNTER — Ambulatory Visit (INDEPENDENT_AMBULATORY_CARE_PROVIDER_SITE_OTHER): Payer: 59 | Admitting: Obstetrics

## 2020-09-04 VITALS — BP 112/70 | Wt 215.8 lb

## 2020-09-04 DIAGNOSIS — Z3A28 28 weeks gestation of pregnancy: Secondary | ICD-10-CM

## 2020-09-04 DIAGNOSIS — O358XX0 Maternal care for other (suspected) fetal abnormality and damage, not applicable or unspecified: Secondary | ICD-10-CM

## 2020-09-04 DIAGNOSIS — O26849 Uterine size-date discrepancy, unspecified trimester: Secondary | ICD-10-CM

## 2020-09-04 DIAGNOSIS — Z3A30 30 weeks gestation of pregnancy: Secondary | ICD-10-CM

## 2020-09-04 DIAGNOSIS — O35EXX0 Maternal care for other (suspected) fetal abnormality and damage, fetal genitourinary anomalies, not applicable or unspecified: Secondary | ICD-10-CM

## 2020-09-04 DIAGNOSIS — Z3480 Encounter for supervision of other normal pregnancy, unspecified trimester: Secondary | ICD-10-CM

## 2020-09-04 DIAGNOSIS — Z3A26 26 weeks gestation of pregnancy: Secondary | ICD-10-CM

## 2020-09-04 DIAGNOSIS — Z3491 Encounter for supervision of normal pregnancy, unspecified, first trimester: Secondary | ICD-10-CM

## 2020-09-04 LAB — POCT URINALYSIS DIPSTICK OB
Glucose, UA: NEGATIVE
POC,PROTEIN,UA: NEGATIVE

## 2020-09-04 NOTE — Addendum Note (Signed)
Addended by: Clement Husbands A on: 09/04/2020 04:47 PM   Modules accepted: Orders

## 2020-09-04 NOTE — Progress Notes (Signed)
Routine Prenatal Care Visit  Subjective  Monique Barnett is a 25 y.o. G1P0000 at [redacted]w[redacted]d being seen today for ongoing prenatal care.  She is currently monitored for the following issues for this low-risk pregnancy and has Supervision of low-risk pregnancy, first trimester and Situational depression on their problem list.  ----------------------------------------------------------------------------------- Patient reports no complaints.  She is having her 28 week labs drawn today. Has also had a growth scan and to recheck the renal atalectasis. Contractions: Not present. Vag. Bleeding: None.  Movement: Present. Leaking Fluid denies.  ----------------------------------------------------------------------------------- The following portions of the patient's history were reviewed and updated as appropriate: allergies, current medications, past family history, past medical history, past social history, past surgical history and problem list. Problem list updated.  Objective  Blood pressure 112/70, weight 215 lb 12.8 oz (97.9 kg), last menstrual period 02/16/2020, unknown if currently breastfeeding. Pregravid weight 180 lb (81.6 kg) Total Weight Gain 35 lb 12.8 oz (16.2 kg) Urinalysis: Urine Protein    Urine Glucose    Fetal Status:     Movement: Present     General:  Alert, oriented and cooperative. Patient is in no acute distress.  Skin: Skin is warm and dry. No rash noted.   Cardiovascular: Normal heart rate noted  Respiratory: Normal respiratory effort, no problems with respiration noted  Abdomen: Soft, gravid, appropriate for gestational age. Pain/Pressure: Present     Pelvic:  Cervical exam deferred        Extremities: Normal range of motion.     Mental Status: Normal mood and affect. Normal behavior. Normal judgment and thought content.   Assessment   25 y.o. G1P0000 at [redacted]w[redacted]d by  11/22/2020, by Last Menstrual Period presenting for routine prenatal visit  Plan   pregnancy1  Problems  (from 02/16/20 to present)    Problem Noted Resolved   Situational depression 07/19/2020 by Mirna Mires, CNM No   Overview Signed 07/19/2020  5:35 PM by Mirna Mires, CNM    Started on Zoloft 50 mg daily 07/19/2020 due to situational stress.      Supervision of low-risk pregnancy, first trimester 04/08/2020 by Nadara Mustard, MD No   Overview Addendum 09/04/2020 10:41 AM by Mirna Mires, CNM    Clinic [facility] Prenatal Labs  Dating  CWLMP Blood type: O/Positive/-- (08/09 1536)   Genetic Screen 1 CWScreen:    AFP:     Quad:     NIPS: Antibody:Negative (08/09 1536)  Anatomic [place] 07/08/20-incomplete repeat 2 weeks. Renal atalectasis seen- rescan at 28 wks Rubella: 16.20 (08/09 1536)  Varicella: @VZVIGG @  GTT  ordered. 09/04/2020 RPR: Non Reactive (08/09 1536)   Rhogam   NA HBsAg: Negative (08/09 1536)   Vaccines TDAP:                       Flu Shot: Covid: HIV: Non Reactive (08/09 1536)   Baby Food                         breast       GBS:   Contraception IUD or Nexplanon 10-19-1974  CBB     CS/VBAC    Support Person           Previous Version       Preterm labor symptoms and general obstetric precautions including but not limited to vaginal bleeding, contractions, leaking of fluid and fetal movement were reviewed in detail with the patient. Please refer to After  Visit Summary for other counseling recommendations.  Will make a consulting appt with MFM to discuss the renal pyalectasis.  Return in about 2 weeks (around 09/18/2020) for return OB.  Mirna Mires, CNM  09/04/2020 10:43 AM

## 2020-09-05 ENCOUNTER — Other Ambulatory Visit: Payer: 59

## 2020-09-05 ENCOUNTER — Encounter: Payer: 59 | Admitting: Obstetrics & Gynecology

## 2020-09-05 ENCOUNTER — Ambulatory Visit: Payer: 59

## 2020-09-05 LAB — 28 WEEKS RH-PANEL
Antibody Screen: NEGATIVE
Basophils Absolute: 0 10*3/uL (ref 0.0–0.2)
Basos: 0 %
EOS (ABSOLUTE): 0.1 10*3/uL (ref 0.0–0.4)
Eos: 1 %
Gestational Diabetes Screen: 123 mg/dL (ref 65–139)
HIV Screen 4th Generation wRfx: NONREACTIVE
Hematocrit: 27.9 % — ABNORMAL LOW (ref 34.0–46.6)
Hemoglobin: 9.6 g/dL — ABNORMAL LOW (ref 11.1–15.9)
Immature Grans (Abs): 0.1 10*3/uL (ref 0.0–0.1)
Immature Granulocytes: 1 %
Lymphocytes Absolute: 1.3 10*3/uL (ref 0.7–3.1)
Lymphs: 14 %
MCH: 31.6 pg (ref 26.6–33.0)
MCHC: 34.4 g/dL (ref 31.5–35.7)
MCV: 92 fL (ref 79–97)
Monocytes Absolute: 0.6 10*3/uL (ref 0.1–0.9)
Monocytes: 6 %
Neutrophils Absolute: 7.3 10*3/uL — ABNORMAL HIGH (ref 1.4–7.0)
Neutrophils: 78 %
Platelets: 218 10*3/uL (ref 150–450)
RBC: 3.04 x10E6/uL — ABNORMAL LOW (ref 3.77–5.28)
RDW: 12.1 % (ref 11.7–15.4)
RPR Ser Ql: NONREACTIVE
WBC: 9.4 10*3/uL (ref 3.4–10.8)

## 2020-09-09 ENCOUNTER — Encounter: Payer: Self-pay | Admitting: Obstetrics

## 2020-09-09 DIAGNOSIS — O99013 Anemia complicating pregnancy, third trimester: Secondary | ICD-10-CM | POA: Insufficient documentation

## 2020-09-09 HISTORY — DX: Anemia complicating pregnancy, third trimester: O99.013

## 2020-09-09 NOTE — Progress Notes (Signed)
Called patient to discuss her low iron stores at 28 weeks. She will start on a daily iron tablet. Mirna Mires, CNM  09/09/2020 11:30 AM

## 2020-09-10 ENCOUNTER — Other Ambulatory Visit: Payer: Self-pay | Admitting: Obstetrics

## 2020-09-10 DIAGNOSIS — O35EXX1 Maternal care for other (suspected) fetal abnormality and damage, fetal genitourinary anomalies, fetus 1: Secondary | ICD-10-CM

## 2020-09-10 DIAGNOSIS — O358XX1 Maternal care for other (suspected) fetal abnormality and damage, fetus 1: Secondary | ICD-10-CM

## 2020-09-12 ENCOUNTER — Ambulatory Visit: Payer: 59 | Attending: Obstetrics

## 2020-09-12 ENCOUNTER — Other Ambulatory Visit: Payer: Self-pay

## 2020-09-12 ENCOUNTER — Ambulatory Visit (HOSPITAL_BASED_OUTPATIENT_CLINIC_OR_DEPARTMENT_OTHER): Payer: 59 | Admitting: Obstetrics

## 2020-09-12 DIAGNOSIS — O99891 Other specified diseases and conditions complicating pregnancy: Secondary | ICD-10-CM | POA: Diagnosis not present

## 2020-09-12 DIAGNOSIS — E669 Obesity, unspecified: Secondary | ICD-10-CM | POA: Insufficient documentation

## 2020-09-12 DIAGNOSIS — O99013 Anemia complicating pregnancy, third trimester: Secondary | ICD-10-CM

## 2020-09-12 DIAGNOSIS — Z3A29 29 weeks gestation of pregnancy: Secondary | ICD-10-CM | POA: Diagnosis not present

## 2020-09-12 DIAGNOSIS — O358XX0 Maternal care for other (suspected) fetal abnormality and damage, not applicable or unspecified: Secondary | ICD-10-CM

## 2020-09-12 DIAGNOSIS — N1330 Unspecified hydronephrosis: Secondary | ICD-10-CM | POA: Diagnosis not present

## 2020-09-12 DIAGNOSIS — O283 Abnormal ultrasonic finding on antenatal screening of mother: Secondary | ICD-10-CM | POA: Diagnosis present

## 2020-09-12 DIAGNOSIS — O99213 Obesity complicating pregnancy, third trimester: Secondary | ICD-10-CM | POA: Diagnosis not present

## 2020-09-12 DIAGNOSIS — O09523 Supervision of elderly multigravida, third trimester: Secondary | ICD-10-CM

## 2020-09-12 DIAGNOSIS — O35EXX1 Maternal care for other (suspected) fetal abnormality and damage, fetal genitourinary anomalies, fetus 1: Secondary | ICD-10-CM

## 2020-09-12 DIAGNOSIS — O358XX1 Maternal care for other (suspected) fetal abnormality and damage, fetus 1: Secondary | ICD-10-CM

## 2020-09-12 NOTE — Progress Notes (Signed)
MFM Consult Note  Monique Barnett was seen for a detailed fetal anatomy scan and consultation due to pyelectasis that was noted on a recent ultrasound performed in your office.  She denies any significant past medical history and denies any problems in her current pregnancy.    She had a cell free DNA test earlier in her pregnancy which indicated a low risk for trisomy 48, 72, and 13. A female fetus is predicted.   She was informed that the fetal growth and amniotic fluid level were appropriate for her gestational age.   Left pyelectasis measuring 0.8 to 0.9 cm dilated was noted on today's ultrasound exam.  The implications and management of pylectasis was discussed with the patient. She was advised that the pyelectasis noted on prenatal ultrasounds will often resolve spontaneously after birth. However, there may also be other processes such as an obstruction or reflux causing this finding that may require treatment after birth.  She was advised that we will continue to follow her closely to assess this finding.  Should the renal pelvis continue to enlarge, we will consider a referral to pediatric urology/nephrology for a prenatal consultation.  The small association between pyelectasis and Down syndrome was discussed.  She was offered and declined an amniocentesis today for definitive diagnosis of fetal aneuploidy.  She was reassured by the low risk for Down syndrome indicated by her cell free DNA test.  The views of the fetal anatomy were limited today due to her advanced gestational age.  The patient was informed that anomalies may be missed due to technical limitations. If the fetus is in a suboptimal position or maternal habitus is increased, visualization of the fetus in the maternal uterus may be impaired.   A follow-up exam was scheduled in 5 weeks for assessment of the fetal kidneys.  At the end of the consultation, the patient stated that all of her questions had been answered to her  complete satisfaction.  Thank you for referring this patient for a Maternal-Fetal Medicine consultation.    Total time spent in consultation: 35 minutes.

## 2020-09-17 ENCOUNTER — Ambulatory Visit: Payer: Self-pay

## 2020-09-18 ENCOUNTER — Other Ambulatory Visit: Payer: Self-pay

## 2020-09-18 ENCOUNTER — Ambulatory Visit (INDEPENDENT_AMBULATORY_CARE_PROVIDER_SITE_OTHER): Payer: 59 | Admitting: Obstetrics and Gynecology

## 2020-09-18 VITALS — Wt 218.0 lb

## 2020-09-18 DIAGNOSIS — O99343 Other mental disorders complicating pregnancy, third trimester: Secondary | ICD-10-CM | POA: Diagnosis not present

## 2020-09-18 DIAGNOSIS — D649 Anemia, unspecified: Secondary | ICD-10-CM

## 2020-09-18 DIAGNOSIS — F4321 Adjustment disorder with depressed mood: Secondary | ICD-10-CM | POA: Diagnosis not present

## 2020-09-18 DIAGNOSIS — O99013 Anemia complicating pregnancy, third trimester: Secondary | ICD-10-CM

## 2020-09-18 DIAGNOSIS — Z3403 Encounter for supervision of normal first pregnancy, third trimester: Secondary | ICD-10-CM

## 2020-09-18 DIAGNOSIS — U071 COVID-19: Secondary | ICD-10-CM | POA: Diagnosis not present

## 2020-09-18 DIAGNOSIS — O98513 Other viral diseases complicating pregnancy, third trimester: Secondary | ICD-10-CM | POA: Diagnosis not present

## 2020-09-18 DIAGNOSIS — Z3A30 30 weeks gestation of pregnancy: Secondary | ICD-10-CM

## 2020-09-18 NOTE — Progress Notes (Signed)
    Routine Prenatal Care Visit  Virtual Visit via Telephone Note  I connected with Monique Barnett on 09/18/20 at  3:50 PM EST by telephone and verified that I am speaking with the correct person using two identifiers.   I discussed the limitations, risks, security and privacy concerns of performing an evaluation and management service by telephone and the availability of in person appointments. I also discussed with the patient that there may be a patient responsible charge related to this service. The patient expressed understanding and agreed to proceed.  The patient was at home. I spoke with the patient from my office. The names of people involved in this encounter were: [facility] and Zipporah Plants.   Subjective  Monique Barnett is a 25 y.o. G1P0000 at [redacted]w[redacted]d scheduled for a telehealth visit for her ongoing prenatal care.  She is currently monitored for the following issues for this low-risk pregnancy and has Supervision of low-risk first pregnancy, third trimester; Situational depression; and Anemia in pregnancy, third trimester on their problem list.  ----------------------------------------------------------------------------------- Patient reports COVID-19 exposure this week, now with sx of COVID-19. Patient states her boyfriend tested positive for COVID-19 earlier in the week. Yesterday, the patient reports she developed a fever which has resolved today. Patient states she also has experienced significant congestion. Patient will be tested for COVID-19 on Saturday, which was the first available test she could get through her PCP. Patient reports good fetal movement and denies pregnancy related concerns today.    Contractions: Not present. Vag. Bleeding: None.  Movement: Present. Denies leaking of fluid.  ----------------------------------------------------------------------------------- The following portions of the patient's history were reviewed and updated as appropriate:  allergies, current medications, past family history, past medical history, past social history, past surgical history and problem list. Problem list updated.    Observations/Objective: Physical Exam could not be performed. Because of the COVID-19 outbreak this visit was performed over the phone and not in person.   Assessment and Plan: 25 y.o. G1P0000 at [redacted]w[redacted]d by  11/22/2020, by Last Menstrual Period presenting for routine prenatal visit with probable COVID-19 infection with mild disease presentation.  -Covid-19 infection with mild presentation - encouraged hydration and sx treatment with tylenol, discussed "red flag" sx including SOB, chest pain, and when to present to the hospital for additional care if needed  -Reviewed preterm labor precautions  Follow Up Instructions: A message was sent to the front desk to call the patient and schedule a 2 week f/u visit for ROB.   I discussed the assessment and treatment plan with the patient. The patient was provided an opportunity to ask questions and all were answered. The patient agreed with the plan and demonstrated an understanding of the instructions.   The patient was advised to call back or seek an in-person evaluation if the symptoms worsen or if the condition fails to improve as anticipated.  I provided 10 minutes of non-face-to-face time during this encounter.  Zipporah Plants, CNM, MSN [facility], [facility] 09/18/2020 8:34 PM

## 2020-10-03 ENCOUNTER — Emergency Department: Payer: 59

## 2020-10-03 ENCOUNTER — Observation Stay
Admission: EM | Admit: 2020-10-03 | Discharge: 2020-10-03 | Disposition: A | Discharge status: Home / Self Care | Payer: 59 | Attending: Obstetrics and Gynecology | Admitting: Obstetrics and Gynecology

## 2020-10-03 ENCOUNTER — Encounter: Payer: Self-pay | Admitting: *Deleted

## 2020-10-03 ENCOUNTER — Encounter: Payer: 59 | Admitting: Obstetrics and Gynecology

## 2020-10-03 ENCOUNTER — Other Ambulatory Visit: Payer: Self-pay

## 2020-10-03 DIAGNOSIS — R1030 Lower abdominal pain, unspecified: Secondary | ICD-10-CM | POA: Diagnosis not present

## 2020-10-03 DIAGNOSIS — O26899 Other specified pregnancy related conditions, unspecified trimester: Secondary | ICD-10-CM | POA: Diagnosis not present

## 2020-10-03 DIAGNOSIS — O26893 Other specified pregnancy related conditions, third trimester: Secondary | ICD-10-CM | POA: Diagnosis present

## 2020-10-03 DIAGNOSIS — Z3490 Encounter for supervision of normal pregnancy, unspecified, unspecified trimester: Secondary | ICD-10-CM

## 2020-10-03 DIAGNOSIS — R197 Diarrhea, unspecified: Secondary | ICD-10-CM

## 2020-10-03 DIAGNOSIS — R0781 Pleurodynia: Secondary | ICD-10-CM

## 2020-10-03 DIAGNOSIS — Z3A32 32 weeks gestation of pregnancy: Secondary | ICD-10-CM | POA: Diagnosis not present

## 2020-10-03 DIAGNOSIS — R079 Chest pain, unspecified: Secondary | ICD-10-CM | POA: Diagnosis not present

## 2020-10-03 DIAGNOSIS — Z3A33 33 weeks gestation of pregnancy: Secondary | ICD-10-CM | POA: Diagnosis not present

## 2020-10-03 LAB — CBC
HCT: 28.1 % — ABNORMAL LOW (ref 36.0–46.0)
Hemoglobin: 9.1 g/dL — ABNORMAL LOW (ref 12.0–15.0)
MCH: 30.4 pg (ref 26.0–34.0)
MCHC: 32.4 g/dL (ref 30.0–36.0)
MCV: 94 fL (ref 80.0–100.0)
Platelets: 204 10*3/uL (ref 150–400)
RBC: 2.99 MIL/uL — ABNORMAL LOW (ref 3.87–5.11)
RDW: 14.4 % (ref 11.5–15.5)
WBC: 9.2 10*3/uL (ref 4.0–10.5)
nRBC: 0 % (ref 0.0–0.2)

## 2020-10-03 LAB — HEPATIC FUNCTION PANEL
ALT: 10 U/L (ref 0–44)
AST: 16 U/L (ref 15–41)
Albumin: 2.7 g/dL — ABNORMAL LOW (ref 3.5–5.0)
Alkaline Phosphatase: 136 U/L — ABNORMAL HIGH (ref 38–126)
Bilirubin, Direct: <0.1 mg/dL (ref 0.0–0.2)
Total Bilirubin: 0.3 mg/dL (ref 0.3–1.2)
Total Protein: 6.2 g/dL — ABNORMAL LOW (ref 6.5–8.1)

## 2020-10-03 LAB — BASIC METABOLIC PANEL
Anion gap: 10 (ref 5–15)
BUN: 7 mg/dL (ref 6–20)
CO2: 22 mmol/L (ref 22–32)
Calcium: 9.1 mg/dL (ref 8.9–10.3)
Chloride: 107 mmol/L (ref 98–111)
Creatinine, Ser: 0.65 mg/dL (ref 0.44–1.00)
GFR, Estimated: >60 mL/min (ref >60)
Glucose, Bld: 119 mg/dL — ABNORMAL HIGH (ref 70–99)
Potassium: 3.4 mmol/L — ABNORMAL LOW (ref 3.5–5.1)
Sodium: 139 mmol/L (ref 135–145)

## 2020-10-03 LAB — FIBRIN DERIVATIVES D-DIMER (ARMC ONLY): Fibrin derivatives D-dimer (ARMC): 737.58 ng/mL (FEU) — ABNORMAL HIGH (ref 0.00–499.00)

## 2020-10-03 LAB — RUPTURE OF MEMBRANE (ROM)PLUS: Rom Plus: NEGATIVE

## 2020-10-03 LAB — TROPONIN I (HIGH SENSITIVITY)
Troponin I (High Sensitivity): 8 ng/L (ref <18)
Troponin I (High Sensitivity): 7 ng/L (ref <18)

## 2020-10-03 LAB — LIPASE, BLOOD: Lipase: 29 U/L (ref 11–51)

## 2020-10-03 MED ORDER — ACETAMINOPHEN 500 MG PO TABS: 1000.0000 mg | ORAL_TABLET | Freq: Once | ORAL | Status: DC

## 2020-10-03 MED ORDER — IOHEXOL 350 MG/ML SOLN
75.0000 mL | Freq: Once | INTRAVENOUS | Status: AC | PRN
  Administered 2020-10-03: 75 mL via INTRAVENOUS

## 2020-10-03 NOTE — OB Triage Note (Signed)
Pt arrived at [facility] with complaints of abdominal cramps. Pt was seen in the ED for chest pain and was cleared to be moved to L&D for evaluation. Pt states the abdominal cramps have been happening intermittently for the past few weeks and are lower midline. Pt rates pain 3/10. Pt states it feels like period cramps.Pt denies ctx and vaginal bleeding. Pt mentioned possible LOF earlier but it was ruled out by lab in ED by ROM plus. Pt states positive FM. Monitors applied and assessing.

## 2020-10-03 NOTE — OB Triage Note (Signed)
Pt discharged home in stable condition. RN provided discharge instructions to pt, including information on when to come back. Pt verbalized understanding and all questions answered at this time.  

## 2020-10-03 NOTE — Progress Notes (Signed)
RN applied external fetal monitoring and toco to soft non tender abdomen. Initial FHR 150s. Patient denies active vaginal bleeding. Patient communicated that she leaked fluid earlier 10/02/20. Denies contractions and states positive fetal movement. States that she has had sexual intercourse in the last 24 hours and tested her breast pump yesterday 10/02/20.

## 2020-10-03 NOTE — ED Notes (Signed)
US at bedside

## 2020-10-03 NOTE — Final Progress Note (Signed)
Final Progress Note  Patient ID: Monique Barnett MRN: [id number] DOB/AGE: 09-14-1995 25 y.o.  Admit date: 10/03/2020 Admitting provider: Vena Austria, MD Discharge date: 10/03/2020   Admission Diagnoses: Abdominal pain during pregnancy, third trimester  Discharge Diagnoses:  Active Problems:   Pregnancy   Abdominal pain during pregnancy, third trimester   History of Present Illness: The patient is a 25 y.o. female G1P0000 at [redacted]w[redacted]d who presents from the ER for lower abdomen cramping and abdominal pain, associated with diarrhea. Patient reports that over the last three days she has experienced "stomach aches." She reports she recently started iron supplementation and believes this maybe related. She states that within the last 24 hours she had 2-3 episodes of diarrhea that with associated abdominal "cramping" that resolved after the bowel movements. Additionally, patient reports pleuritic-type chest pain, for which she was evaluated in the ER.   Past Medical History:  Diagnosis Date  . Kidney stone     Past Surgical History:  Procedure Laterality Date  . MOUTH SURGERY      No current facility-administered medications on file prior to encounter.   Current Outpatient Medications on File Prior to Encounter  Medication Sig Dispense Refill  . Prenatal Vit-Fe Fumarate-FA (PRENATAL VITAMIN) 27-0.8 MG TABS Take 1 tablet by mouth daily. OLLY PRENATAL VITAMIN - OTC    . sertraline (ZOLOFT) 50 MG tablet Take 1 tablet (50 mg total) by mouth daily. 30 tablet 2  . Prenatal Vit-Fe Fumarate-FA (PRENATAL VITAMINS) 28-0.8 MG TABS Take 1 tablet by mouth daily. (Patient not taking: Reported on 08/19/2020)      Allergies  Allergen Reactions  . Penicillins Nausea And Vomiting    Social History   Socioeconomic History  . Marital status: Single    Spouse name: Not on file  . Number of children: Not on file  . Years of education: Not on file  . Highest education level: Not on file   Occupational History  . Not on file  Tobacco Use  . Smoking status: Never Smoker  . Smokeless tobacco: Never Used  Substance and Sexual Activity  . Alcohol use: No  . Drug use: No  . Sexual activity: Yes    Partners: Male    Birth control/protection: None  Other Topics Concern  . Not on file  Social History Narrative  . Not on file   Social Determinants of Health   Financial Resource Strain: Not on file  Food Insecurity: Not on file  Transportation Needs: Not on file  Physical Activity: Not on file  Stress: Not on file  Social Connections: Not on file  Intimate Partner Violence: Not on file    Family History  Problem Relation Age of Onset  . Anxiety disorder Maternal Grandmother   . Cancer Maternal Grandfather   . Diabetes Paternal Grandfather      Review of Systems  Constitutional: Negative for fever and malaise/fatigue.  HENT: Negative.   Eyes: Negative.   Respiratory: Negative for cough and shortness of breath.   Cardiovascular: Positive for chest pain. Negative for leg swelling.       Has had complete evaluation in ER for CP.  Gastrointestinal: Positive for abdominal pain and diarrhea. Negative for nausea and vomiting.  Genitourinary: Negative.   Musculoskeletal: Negative.   Skin: Negative.   Neurological: Negative.   Endo/Heme/Allergies: Negative.   Psychiatric/Behavioral: Negative.      Physical Exam: BP 123/67 (BP Location: Right Arm)   Pulse 63   Temp 98.3 F (36.8 C) (Oral)  Resp 18   Ht 5\' 4"  (1.626 m)   Wt 99.3 kg   LMP 02/16/2020   SpO2 100%   BMI 37.59 kg/m   Physical Exam Constitutional:      General: She is not in acute distress.    Appearance: She is well-developed. She is not toxic-appearing.  Cardiovascular:     Rate and Rhythm: Normal rate and regular rhythm.  Pulmonary:     Effort: Pulmonary effort is normal. No respiratory distress.     Breath sounds: Normal breath sounds.  Abdominal:     Comments: Gravid, size = dates   Musculoskeletal:     Right lower leg: No edema.     Left lower leg: No edema.  Neurological:     Mental Status: She is alert and oriented to person, place, and time.  Skin:    General: Skin is warm and dry.  Psychiatric:        Mood and Affect: Mood normal.        Behavior: Behavior normal.    Consults: Completed full ER evaluation prior to placement in OB triage  Significant Findings/ Diagnostic Studies: ROM + negative in ER  Procedures:  NONSTRESS TEST INTERPRETATION  INDICATIONS: rule out uterine contractions FHR baseline: 125 RESULTS:  A NST procedure was performed with FHR monitoring and a normal baseline established, appropriate time of 20-40 minutes of evaluation, and accels >2 seen w 15x15 characteristics.  Results show a REACTIVE NST.   Toco: uterine irritability  Hospital Course: The patient was admitted to Labor and Delivery Triage for observation. Patient had complete evaluation for chest pain in ER. Patient had ROM+ completed during that evaluation which was negative. Exam in ER reassuring, cervical os closed. Patient resting comfortably throughout history taking portion of the exam. Patient states the abdominal pain was associated with BMs and believes it was related to recent start of Fe supplementation. Fetal status reassuring: NST reactive, no contractions palpated during exam or reported by patient. Plan made with patient to discharge home, schedule f/u visit within week at [facility]  Discharge Condition: good  Disposition: Discharge disposition: 01-Home or Self Care      Diet: Regular diet  Discharge Activity: Activity as tolerated   Allergies as of 10/03/2020      Reactions   Penicillins Nausea And Vomiting      Medication List    TAKE these medications   Prenatal Vitamin 27-0.8 MG Tabs Take 1 tablet by mouth daily. OLLY PRENATAL VITAMIN - OTC What changed: Another medication with the same name was removed. Continue taking this medication,  and follow the directions you see here.   sertraline 50 MG tablet Commonly known as: Zoloft Take 1 tablet (50 mg total) by mouth daily.       Follow-up Information    [facility], [facility]. Schedule an appointment as soon as possible for a visit in 2 days.   Contact information: [address] [place] [phone]               Total time spent taking care of this patient: 15 minutes  Signed:  [phone], CNM 10/03/2020, 6:34 AM

## 2020-10-03 NOTE — Discharge Summary (Signed)
See final progress note. 

## 2020-10-03 NOTE — ED Triage Notes (Signed)
Pt reporting she has had a hx of GERD with her pregnancy but today the chest pain started to radiate to her back and under left breast and felt different than normal. Pain increases with inhalation with pt reporting dyspnea. Pt also having diarrhea and abd cramping and vaginal pain while ambulating. Pt is [redacted] weeks pregnant without complications to this point.   Pt had COVID in January.

## 2020-10-03 NOTE — Discharge Instructions (Signed)

## 2020-10-03 NOTE — ED Provider Notes (Signed)
[facility] Emergency Department Provider Note  ____________________________________________  Time seen: Approximately 1:22 AM  I have reviewed the triage vital signs and the nursing notes.   HISTORY  Chief Complaint Chest Pain and Abdominal Pain   HPI Monique Barnett is a 25 y.o. female G1P0 at [redacted] weeks GA who presents for evaluation of chest pain and abdominal pain. Patient reports that she started having cramping lower abdominal pain radiating to her lower back associated with 2-3 episodes of diarrhea earlier today. She felt that she had some clear liquid coming from her vagina. The abdominal pain and cramping subsided and she started having chest pain. She describes the pain as sharp, pleuritic, that starts in the center of her chest radiates around bilateral rib cage onto her back. She denies shortness of breath but does report that the pain is worse with deep breathing. She denies fever or chills. Patient did have Covid a month ago and reports having a persistent cough since then. But denies hemoptysis. No personal or family history of PE or DVT, no recent travel immobilization, no leg pain or swelling, no hemoptysis or exogenous hormones. The pain is been constant for the last couple of hours. The pain is not severe in nature.  She does have a history of GERD with her pregnancy but she reports that the pain today felt different and normal with the radiation to the back and under her breasts.  Past Medical History:  Diagnosis Date  . Kidney stone     Patient Active Problem List   Diagnosis Date Noted  . Anemia in pregnancy, third trimester 09/09/2020  . Situational depression 07/19/2020  . Supervision of low-risk first pregnancy, third trimester 04/08/2020    Past Surgical History:  Procedure Laterality Date  . MOUTH SURGERY      Prior to Admission medications   Medication Sig Start Date End Date Taking? Authorizing Provider  Prenatal Vit-Fe  Fumarate-FA (PRENATAL VITAMIN) 27-0.8 MG TABS Take 1 tablet by mouth daily. OLLY PRENATAL VITAMIN - OTC    [provider]  Prenatal Vit-Fe Fumarate-FA (PRENATAL VITAMINS) 28-0.8 MG TABS Take 1 tablet by mouth daily. Patient not taking: Reported on 08/19/2020    [provider]  sertraline (ZOLOFT) 50 MG tablet Take 1 tablet (50 mg total) by mouth daily. 07/19/20   Mirna Mires, CNM    Allergies Penicillins  Family History  Problem Relation Age of Onset  . Anxiety disorder Maternal Grandmother   . Cancer Maternal Grandfather   . Diabetes Paternal Grandfather     Social History Social History   Tobacco Use  . Smoking status: Never Smoker  . Smokeless tobacco: Never Used  Substance Use Topics  . Alcohol use: No  . Drug use: No    Review of Systems  Constitutional: Negative for fever. Eyes: Negative for visual changes. ENT: Negative for sore throat. Neck: No neck pain  Cardiovascular: + chest pain. Respiratory: Negative for shortness of breath. + cough Gastrointestinal: + lower abdominal pain and diarrhea. No vomiting  Genitourinary: Negative for dysuria. Musculoskeletal: Negative for back pain. Skin: Negative for rash. Neurological: Negative for headaches, weakness or numbness. Psych: No SI or HI  ____________________________________________   PHYSICAL EXAM:  VITAL SIGNS: ED Triage Vitals  Enc Vitals Group     BP 10/03/20 0054 132/82     Pulse Rate 10/03/20 0054 91     Resp 10/03/20 0054 16     Temp 10/03/20 0054 98.8 F (37.1 C)  Temp Source 10/03/20 0054 Oral     SpO2 10/03/20 0054 99 %     Weight 10/03/20 0052 219 lb (99.3 kg)     Height 10/03/20 0052 5\' 4"  (1.626 m)     Head Circumference --      Peak Flow --      Pain Score 10/03/20 0052 5     Pain Loc --      Pain Edu? --      Excl. in GC? --     Constitutional: Alert and oriented. Well appearing and in no apparent distress. HEENT:      Head: Normocephalic and  atraumatic.         Eyes: Conjunctivae are normal. Sclera is non-icteric.       Mouth/Throat: Mucous membranes are moist.       Neck: Supple with no signs of meningismus. Cardiovascular: Regular rate and rhythm. No murmurs, gallops, or rubs. 2+ symmetrical distal pulses are present in all extremities. No JVD. Respiratory: Normal respiratory effort. Lungs are clear to auscultation bilaterally. No wheezes, crackles, or rhonchi.  Gastrointestinal: Gravid, non tender, and non distended with positive bowel sounds. No rebound or guarding. Pelvic exam: Normal external genitalia, no rashes or lesions. Normal cervical mucus. Os closed. No cervical motion tenderness.  No uterine or adnexal tenderness.   Musculoskeletal:  No edema, cyanosis, or erythema of extremities. Neurologic: Normal speech and language. Face is symmetric. Moving all extremities. No gross focal neurologic deficits are appreciated. Skin: Skin is warm, dry and intact. No rash noted. Psychiatric: Mood and affect are normal. Speech and behavior are normal.  ____________________________________________   LABS (all labs ordered are listed, but only abnormal results are displayed)  Labs Reviewed  BASIC METABOLIC PANEL - Abnormal; Notable for the following components:      Result Value   Potassium 3.4 (*)    Glucose, Bld 119 (*)    All other components within normal limits  CBC - Abnormal; Notable for the following components:   RBC 2.99 (*)    Hemoglobin 9.1 (*)    HCT 28.1 (*)    All other components within normal limits  FIBRIN DERIVATIVES D-DIMER ([facility] ONLY) - Abnormal; Notable for the following components:   Fibrin derivatives D-dimer ([facility]) 737.58 (*)    All other components within normal limits  HEPATIC FUNCTION PANEL - Abnormal; Notable for the following components:   Total Protein 6.2 (*)    Albumin 2.7 (*)    Alkaline Phosphatase 136 (*)    All other components within normal limits  ROM PLUS ([facility] ONLY)  LIPASE,  BLOOD  TROPONIN I (HIGH SENSITIVITY)  TROPONIN I (HIGH SENSITIVITY)   ____________________________________________  EKG  ED ECG REPORT I, Nita Sickle, the attending physician, personally viewed and interpreted this ECG.  Normal sinus rhythm, rate of 85, normal intervals, normal axis, no ST elevations or depressions. No significant changes when compared to prior from January 2020 ____________________________________________  RADIOLOGY  I have personally reviewed the images performed during this visit and I agree with the Radiologist's read.   Interpretation by Radiologist:  DG Chest 1 View  Result Date: 10/03/2020 CLINICAL DATA:  Chest pain radiating to back and left breast EXAM: CHEST  1 VIEW COMPARISON:  None. FINDINGS: Accounting for body habitus, the lungs are clear. No consolidation, features of edema, pneumothorax, or effusion. Suspect small hiatal hernia. Remaining cardiomediastinal contours are unremarkable. No acute osseous or soft tissue abnormality. Telemetry leads overlie the chest. IMPRESSION: Small hiatal hernia.  No other acute cardiopulmonary abnormality. Electronically Signed   By: Kreg Shropshire M.D.   On: 10/03/2020 02:01   CT Angio Chest PE W and/or Wo Contrast  Result Date: 10/03/2020 CLINICAL DATA:  Chest pain radiating to back and under left breast, increasing with inhalation, positive D-dimer EXAM: CT ANGIOGRAPHY CHEST WITH CONTRAST TECHNIQUE: Multidetector CT imaging of the chest was performed using the standard protocol during bolus administration of intravenous contrast. Multiplanar CT image reconstructions and MIPs were obtained to evaluate the vascular anatomy. CONTRAST:  36mL OMNIPAQUE IOHEXOL 350 MG/ML SOLN COMPARISON:  Radiograph 10/03/2020 FINDINGS: Cardiovascular: Satisfactory opacification the pulmonary arteries to the segmental level. No pulmonary artery filling defects are identified. Central pulmonary arteries are normal caliber. Normal heart size. No  pericardial effusion. The aortic root is suboptimally assessed given cardiac pulsation artifact. The aorta is normal caliber. No acute luminal abnormality of the imaged aorta. No periaortic stranding or hemorrhage. Shared origin of the brachiocephalic and left common carotid arteries. Proximal great vessels are otherwise unremarkable. 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 No mediastinal fluid or gas. Normal thyroid gland and thoracic inlet. No acute abnormality of the trachea. Small sliding-type hiatal hernia. No worrisome mediastinal, hilar or axillary adenopathy. Lungs/Pleura: Mild dependent atelectatic changes posteriorly. Trace bilateral effusions, left greater than right. No pneumothorax. No consolidation, features of edema, pneumothorax, or effusion. No suspicious pulmonary nodules or masses. Upper Abdomen: No acute abnormalities present in the visualized portions of the upper abdomen. Heterogeneous enhancement the spleen is typical of arterial phase imaging. Musculoskeletal: No acute osseous abnormality or suspicious osseous lesion. No worrisome chest wall masses or lesions. Review of the MIP images confirms the above findings. IMPRESSION: 1. No evidence of pulmonary embolism. 2. Trace bilateral pleural effusions, left greater than right. 3. Dependent atelectatic changes. 4. 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 Electronically Signed   By: Kreg Shropshire M.D.   On: 10/03/2020 05:21   US Venous Img Lower Bilateral  Result Date: 10/03/2020 CLINICAL DATA:  Chest pain, possible DVT EXAM: BILATERAL LOWER EXTREMITY VENOUS DOPPLER ULTRASOUND TECHNIQUE: Gray-scale sonography with graded compression, as well as color Doppler and duplex ultrasound were  performed to evaluate the lower extremity deep venous systems from the level of the common femoral vein and including the common femoral, femoral, profunda femoral, popliteal and calf veins including the posterior tibial, peroneal and gastrocnemius veins when visible. The superficial great saphenous vein was also interrogated. Spectral Doppler was utilized to evaluate flow at rest and with distal augmentation maneuvers in the common femoral, femoral and popliteal veins. COMPARISON:  None. FINDINGS: RIGHT LOWER EXTREMITY Common Femoral Vein: No evidence of thrombus. Normal compressibility, respiratory phasicity and response to augmentation. Saphenofemoral Junction: No evidence of thrombus. Normal compressibility and flow on color Doppler imaging. Profunda Femoral Vein: No evidence of thrombus. Normal compressibility and flow on color Doppler imaging. Femoral Vein: No evidence of thrombus. Normal compressibility, respiratory phasicity and response to augmentation. Popliteal Vein: No evidence of thrombus. Normal compressibility, respiratory phasicity and response to augmentation. Calf Veins: No evidence of thrombus. Normal compressibility and flow on color Doppler imaging. Superficial Great Saphenous Vein: No evidence of thrombus. Normal compressibility. Venous Reflux:  None. Other Findings:  None. LEFT LOWER EXTREMITY Common Femoral Vein: No evidence  of thrombus. Normal compressibility, respiratory phasicity and response to augmentation. Saphenofemoral Junction: No evidence of thrombus. Normal compressibility and flow on color Doppler imaging. Profunda Femoral Vein: No evidence of thrombus. Normal compressibility and flow on color Doppler imaging. Femoral Vein: No evidence of thrombus. Normal compressibility, respiratory phasicity and response to augmentation. Popliteal Vein: No evidence of thrombus. Normal compressibility, respiratory phasicity and response to augmentation. Calf Veins: No evidence of thrombus. Normal  compressibility and flow on color Doppler imaging. Superficial Great Saphenous Vein: No evidence of thrombus. Normal compressibility. Venous Reflux:  None. Other Findings:  None. IMPRESSION: No evidence of deep venous thrombosis in either lower extremity. Electronically Signed   By: Alcide CleverMark  Lukens M.D.   On: 10/03/2020 03:21      ____________________________________________   PROCEDURES  Procedure(s) performed:yes .1-3 Lead EKG Interpretation Performed by: Nita SickleVeronese, [place], MD Authorized by: Nita SickleVeronese, [place], MD     Interpretation: non-specific     ECG rate assessment: normal     Rhythm: sinus rhythm     Ectopy: none     Critical Care performed: yes  CRITICAL CARE Performed by: Nita Sicklearolina Mariem Skolnick  ?  Total critical care time: 35 min  Critical care time was exclusive of separately billable procedures and treating other patients.  Critical care was necessary to treat or prevent imminent or life-threatening deterioration.  Critical care was time spent personally by me on the following activities: development of treatment plan with patient and/or surrogate as well as nursing, discussions with consultants, evaluation of patient's response to treatment, examination of patient, obtaining history from patient or surrogate, ordering and performing treatments and interventions, ordering and review of laboratory studies, ordering and review of radiographic studies, pulse oximetry and re-evaluation of patient's condition.  ____________________________________________   INITIAL IMPRESSION / ASSESSMENT AND PLAN / ED COURSE  25 y.o. female G1P0 at 2133 weeks GA who presents for evaluation of chest pain and abdominal pain.   # Lower cramping intermittent abd pain radiating to lower back, + diarrhea, + possible clear vaginal fluid/ discharge. Abdomen is gravid with no tenderness, pelvic exam showing a closed os. Will consult labor and delivery for fetal monitoring to rule out preterm labor  and preterm rupture of membranes. Dr. Bonney AidStaebler aware and recommended longer monitoring at L&D once patient is cleared in the ED  # CP: pleuritic CP, recent Covid infection 1 month ago. Ddx GERD, PNA, prolonged Covid infection, PTX, PE, pericarditis, myocarditis, pre-eclampsia, cardiomyopathy. Will check labs, CXR, d-dimer, b/l LE doppler US. EKG with no signs of acute ischemia. Patient connected to cardiac monitoring for cardiorespiratory monitoring. Old medical records reviewed  _________________________ 4:13 AM on 10/03/2020 -----------------------------------------  Patient continuously monitored for almost 2 hours with no signs of contractions, labor, or fetal distress.  Testing for premature rupture of membranes was negative.  Based on work-up no signs of HELLP syndrome or preeclampsia, cardiomyopathy, PNA, PTX.  Chest x-ray visualized by me with no acute findings, confirmed by radiology.  Two high-sensitivity troponins were negative.  No electrolyte derangements, no leukocytosis, no thrombocytosis, no abnormal LFTs or lipase.  Mild stable anemia.  Doppler studies were negative for DVT but unfortunately her D-dimer was elevated at 737.  I had a long discussion with patient and her husband who was at bedside about her results.  Explained to her that we are unable to fully rule out a PE considering an elevated D-dimer and the fact that she is still having pleuritic chest pain.  I did explain to her that at  this time I believe that clinically based on her vitals and a negative Doppler that the chances of her having a PE are very small.  I discussed risks and benefits of getting a CT angiogram versus a VQ scan.  After answering all their questions patient and husband decided to undergo CT angio of the chest.  That is pending.    _________________________ 5:28 AM on 10/03/2020 -----------------------------------------  CTA negative for PE or any other acute findings other than very small b/l pleural  effusions, visualized by me and confirmed by radiology. Possible pleurisy. Since she is pregnant will avoid NSAIDs and treat with tylenol. We will send patient to labor and delivery for further monitoring of her pregnancy. _____________________________________________ Please note:  Patient was evaluated in Emergency Department today for the symptoms described in the history of present illness. Patient was evaluated in the context of the global COVID-19 pandemic, which necessitated consideration that the patient might be at risk for infection with the SARS-CoV-2 virus that causes COVID-19. Institutional protocols and algorithms that pertain to the evaluation of patients at risk for COVID-19 are in a state of rapid change based on information released by regulatory bodies including the CDC and federal and state organizations. These policies and algorithms were followed during the patient's care in the ED.  Some ED evaluations and interventions may be delayed as a result of limited staffing during the pandemic.   [place] Controlled Substance Database was reviewed by me. ____________________________________________   FINAL CLINICAL IMPRESSION(S) / ED DIAGNOSES   Final diagnoses:  Pleuritic chest pain  Pregnancy with abdominal cramping of lower quadrant, antepartum      NEW MEDICATIONS STARTED DURING THIS VISIT:  ED Discharge Orders    None       Note:  This document was prepared using Dragon voice recognition software and may include unintentional dictation errors.    Don Perking, [place], MD 10/03/20 [phone]

## 2020-10-09 ENCOUNTER — Encounter: Payer: Self-pay | Admitting: Obstetrics and Gynecology

## 2020-10-09 ENCOUNTER — Ambulatory Visit (INDEPENDENT_AMBULATORY_CARE_PROVIDER_SITE_OTHER): Payer: 59 | Admitting: Obstetrics and Gynecology

## 2020-10-09 VITALS — Wt 223.0 lb

## 2020-10-09 DIAGNOSIS — F4321 Adjustment disorder with depressed mood: Secondary | ICD-10-CM

## 2020-10-09 DIAGNOSIS — M549 Dorsalgia, unspecified: Secondary | ICD-10-CM

## 2020-10-09 DIAGNOSIS — D649 Anemia, unspecified: Secondary | ICD-10-CM

## 2020-10-09 DIAGNOSIS — O99013 Anemia complicating pregnancy, third trimester: Secondary | ICD-10-CM

## 2020-10-09 DIAGNOSIS — O26893 Other specified pregnancy related conditions, third trimester: Secondary | ICD-10-CM

## 2020-10-09 DIAGNOSIS — Z3A33 33 weeks gestation of pregnancy: Secondary | ICD-10-CM

## 2020-10-09 DIAGNOSIS — Z8616 Personal history of COVID-19: Secondary | ICD-10-CM

## 2020-10-09 DIAGNOSIS — Z3403 Encounter for supervision of normal first pregnancy, third trimester: Secondary | ICD-10-CM

## 2020-10-09 NOTE — Progress Notes (Signed)
Routine Prenatal Care Visit- Virtual Visit  Subjective   Virtual Visit via Telephone Note  I connected with Monique Barnett on 10/09/20 at  1:50 PM EST by telephone and verified that I am speaking with the correct person using two identifiers.   I discussed the limitations, risks, security and privacy concerns of performing an evaluation and management service by telephone and the availability of in person appointments. I also discussed with the patient that there may be a patient responsible charge related to this service. The patient expressed understanding and agreed to proceed.  The patient was at home. I spoke with the patient from my  Office. The names of people involved in this encounter were: Dr. Jerene Pitch and Monique Barnett  Monique Barnett is a 25 y.o. G1P0000 at [redacted]w[redacted]d being seen today for ongoing prenatal care.  She is currently monitored for the following issues for this low-risk pregnancy and has Supervision of low-risk first pregnancy, third trimester; Situational depression; Anemia in pregnancy, third trimester; Pregnancy; and Pregnancy with abdominal cramping of lower quadrant, antepartum on their problem list.  ----------------------------------------------------------------------------------- Patient reports backache.   Jan 12th covid +, has had a persistent cough.  Contractions: Not present. Vag. Bleeding: None.  Movement: Present. Denies leaking of fluid.  ----------------------------------------------------------------------------------- The following portions of the patient's history were reviewed and updated as appropriate: allergies, current medications, past family history, past medical history, past social history, past surgical history and problem list. Problem list updated.   Objective  Weight 223 lb (101.2 kg), last menstrual period 02/16/2020, unknown if currently breastfeeding. Pregravid weight 180 lb (81.6 kg) Total Weight Gain 43 lb (19.5 kg) Urinalysis:       Fetal Status:     Movement: Present     Physical Exam could not be performed. Because of the COVID-19 outbreak this visit was performed over the phone and not in person.   Assessment   25 y.o. G1P0000 at [redacted]w[redacted]d by  11/22/2020, by Last Menstrual Period presenting for routine prenatal visit  Plan   pregnancy1  Problems (from 02/16/20 to present)    Problem Noted Resolved   Anemia in pregnancy, third trimester 09/09/2020 by Mirna Mires, CNM No   Overview Signed 09/09/2020 11:24 AM by Mirna Mires, CNM    Called by phone and advised to start on an iron tab daily.      Situational depression 07/19/2020 by Mirna Mires, CNM No   Overview Signed 07/19/2020  5:35 PM by Mirna Mires, CNM    Started on Zoloft 50 mg daily 07/19/2020 due to situational stress.      Supervision of low-risk first pregnancy, third trimester 04/08/2020 by Nadara Mustard, MD No   Overview Addendum 10/09/2020  2:32 PM by Natale Milch, MD     Nursing Staff Provider  Office Location  [facility] Dating   LMP  Language  English Anatomy US  Detailed with MFM d/t pyelectasis  Flu Vaccine   Declines Genetic Screen  NIPS: normal XY  TDaP vaccine    Hgb A1C or  GTT Third trimester :123   Covid  unvaccinated Had in jan 2022   LAB RESULTS   Rhogam   not needed Blood Type O/Positive/-- (08/09 1536)   Feeding Plan  breast Antibody Negative (01/05 1014)  Contraception  IUD or nexplanon Rubella 16.20 (08/09 1536)  Circumcision  not planning RPR Non Reactive (01/05 1014)   Pediatrician   HBsAg Negative (08/09 1536)   Support Person  Josue HIV Non Reactive (01/05 1014)  Prenatal Classes  Discussed Varicella  Immune    GBS  (For PCN allergy, check sensitivities)   BTL Consent  not applicable    VBAC Consent  not applicable Pap   NIL    Hgb Electro      CF      SMA               Previous Version       Gestational age appropriate obstetric precautions including but not limited to  vaginal bleeding, contractions, leaking of fluid and fetal movement were reviewed in detail with the patient.     Follow Up Instructions: ROB in 1 week, will need TDaP   I discussed the assessment and treatment plan with the patient. The patient was provided an opportunity to ask questions and all were answered. The patient agreed with the plan and demonstrated an understanding of the instructions.   The patient was advised to call back or seek an in-person evaluation if the symptoms worsen or if the condition fails to improve as anticipated.  I provided 15 minutes of non-face-to-face time during this encounter.  Return in about 1 week (around 10/16/2020) for ROB in person.  Adelene Idler MD [facility], [facility] 10/09/2020 2:33 PM

## 2020-10-09 NOTE — Patient Instructions (Signed)

## 2020-10-09 NOTE — Progress Notes (Signed)
Pt states she was at [facility] on 05/02/10. Pt states she was having some chest and back pains. She was told she had dilated 1 cm.

## 2020-10-15 ENCOUNTER — Other Ambulatory Visit: Payer: Self-pay | Admitting: Obstetrics

## 2020-10-15 DIAGNOSIS — O358XX0 Maternal care for other (suspected) fetal abnormality and damage, not applicable or unspecified: Secondary | ICD-10-CM

## 2020-10-15 DIAGNOSIS — O99210 Obesity complicating pregnancy, unspecified trimester: Secondary | ICD-10-CM

## 2020-10-15 DIAGNOSIS — O35EXX0 Maternal care for other (suspected) fetal abnormality and damage, fetal genitourinary anomalies, not applicable or unspecified: Secondary | ICD-10-CM

## 2020-10-17 ENCOUNTER — Ambulatory Visit: Payer: 59 | Attending: Maternal & Fetal Medicine

## 2020-10-17 ENCOUNTER — Other Ambulatory Visit: Payer: Self-pay

## 2020-10-17 ENCOUNTER — Encounter: Payer: Self-pay | Admitting: Advanced Practice Midwife

## 2020-10-17 ENCOUNTER — Ambulatory Visit (INDEPENDENT_AMBULATORY_CARE_PROVIDER_SITE_OTHER): Payer: 59 | Admitting: Advanced Practice Midwife

## 2020-10-17 VITALS — BP 126/84 | Wt 227.0 lb

## 2020-10-17 DIAGNOSIS — Z3A34 34 weeks gestation of pregnancy: Secondary | ICD-10-CM

## 2020-10-17 DIAGNOSIS — O358XX0 Maternal care for other (suspected) fetal abnormality and damage, not applicable or unspecified: Secondary | ICD-10-CM | POA: Insufficient documentation

## 2020-10-17 DIAGNOSIS — E669 Obesity, unspecified: Secondary | ICD-10-CM | POA: Insufficient documentation

## 2020-10-17 DIAGNOSIS — O35EXX0 Maternal care for other (suspected) fetal abnormality and damage, fetal genitourinary anomalies, not applicable or unspecified: Secondary | ICD-10-CM

## 2020-10-17 DIAGNOSIS — O99213 Obesity complicating pregnancy, third trimester: Secondary | ICD-10-CM | POA: Insufficient documentation

## 2020-10-17 DIAGNOSIS — O99013 Anemia complicating pregnancy, third trimester: Secondary | ICD-10-CM

## 2020-10-17 DIAGNOSIS — O99210 Obesity complicating pregnancy, unspecified trimester: Secondary | ICD-10-CM

## 2020-10-17 DIAGNOSIS — F4321 Adjustment disorder with depressed mood: Secondary | ICD-10-CM

## 2020-10-17 DIAGNOSIS — Z3403 Encounter for supervision of normal first pregnancy, third trimester: Secondary | ICD-10-CM

## 2020-10-17 NOTE — Progress Notes (Signed)
Routine Prenatal Care Visit  Subjective  Monique Barnett is a 25 y.o. G1P0000 at [redacted]w[redacted]d being seen today for ongoing prenatal care.  She is currently monitored for the following issues for this low-risk pregnancy and has Supervision of low-risk first pregnancy, third trimester; Situational depression; Anemia in pregnancy, third trimester; Pregnancy; and Pregnancy with abdominal cramping of lower quadrant, antepartum on their problem list.  ----------------------------------------------------------------------------------- Patient reports no complaints.  She was seen by MFM this AM for follow up fetal renal ultrasound. Renal pyelectasis has resolved. Growth is 59%, 5#15oz, AFI 11.8 cm. Contractions: Not present. Vag. Bleeding: None.  Movement: Present. Leaking Fluid denies.  ----------------------------------------------------------------------------------- The following portions of the patient's history were reviewed and updated as appropriate: allergies, current medications, past family history, past medical history, past social history, past surgical history and problem list. Problem list updated.  Objective  Blood pressure 126/84, weight 227 lb (103 kg), last menstrual period 02/16/2020 Pregravid weight 180 lb (81.6 kg) Total Weight Gain 47 lb (21.3 kg) Urinalysis: Urine Protein    Urine Glucose    Fetal Status: Fetal Heart Rate (bpm): 120   Movement: Present  Presentation: Vertex  General:  Alert, oriented and cooperative. Patient is in no acute distress.  Skin: Skin is warm and dry. No rash noted.   Cardiovascular: Normal heart rate noted  Respiratory: Normal respiratory effort, no problems with respiration noted  Abdomen: Soft, gravid, appropriate for gestational age. Pain/Pressure: Present     Pelvic:  Cervical exam deferred        Extremities: Normal range of motion.  Edema: None  Mental Status: Normal mood and affect. Normal behavior. Normal judgment and thought content.    Assessment   25 y.o. G1P0000 at [redacted]w[redacted]d by  11/22/2020, by Last Menstrual Period presenting for routine prenatal visit  Plan   pregnancy1  Problems (from 02/16/20 to present)    Problem Noted Resolved   Anemia in pregnancy, third trimester 09/09/2020 by Mirna Mires, CNM No   Overview Signed 09/09/2020 11:24 AM by Mirna Mires, CNM    Called by phone and advised to start on an iron tab daily.      Situational depression 07/19/2020 by Mirna Mires, CNM No   Overview Signed 07/19/2020  5:35 PM by Mirna Mires, CNM    Started on Zoloft 50 mg daily 07/19/2020 due to situational stress.      Supervision of low-risk first pregnancy, third trimester 04/08/2020 by Nadara Mustard, MD No   Overview Addendum 10/09/2020  2:32 PM by Natale Milch, MD     Nursing Staff Provider  Office Location  [facility] Dating   LMP  Language  English Anatomy US  Detailed with MFM d/t pyelectasis  Flu Vaccine   Declines Genetic Screen  NIPS: normal XY  TDaP vaccine    Hgb A1C or  GTT Third trimester :123   Covid  unvaccinated Had in jan 2022   LAB RESULTS   Rhogam   not needed Blood Type O/Positive/-- (08/09 1536)   Feeding Plan  breast Antibody Negative (01/05 1014)  Contraception  IUD or nexplanon Rubella 16.20 (08/09 1536)  Circumcision  not planning RPR Non Reactive (01/05 1014)   Pediatrician   HBsAg Negative (08/09 1536)   Support Person  Josue HIV Non Reactive (01/05 1014)  Prenatal Classes  Discussed Varicella  Immune    GBS  (For PCN allergy, check sensitivities)   BTL Consent  not applicable    VBAC Consent  not applicable Pap   NIL    Hgb Electro      CF      SMA               Previous Version       Preterm labor symptoms and general obstetric precautions including but not limited to vaginal bleeding, contractions, leaking of fluid and fetal movement were reviewed in detail with the patient. Please refer to After Visit Summary for other counseling  recommendations.   Return in about 2 weeks (around 10/31/2020) for rob.  Tresea Mall, CNM 10/17/2020 10:00 AM

## 2020-10-17 NOTE — Progress Notes (Signed)
No vb. No lof. Pt had some braxton hicks last night. Pt went today for kidney u/s for baby.

## 2020-10-17 NOTE — Patient Instructions (Signed)

## 2020-10-31 ENCOUNTER — Other Ambulatory Visit: Payer: Self-pay

## 2020-10-31 ENCOUNTER — Ambulatory Visit (INDEPENDENT_AMBULATORY_CARE_PROVIDER_SITE_OTHER): Payer: 59 | Admitting: Obstetrics

## 2020-10-31 VITALS — BP 130/80 | Wt 230.0 lb

## 2020-10-31 DIAGNOSIS — Z113 Encounter for screening for infections with a predominantly sexual mode of transmission: Secondary | ICD-10-CM

## 2020-10-31 DIAGNOSIS — Z3480 Encounter for supervision of other normal pregnancy, unspecified trimester: Secondary | ICD-10-CM

## 2020-10-31 DIAGNOSIS — Z3A36 36 weeks gestation of pregnancy: Secondary | ICD-10-CM

## 2020-10-31 DIAGNOSIS — Z3403 Encounter for supervision of normal first pregnancy, third trimester: Secondary | ICD-10-CM

## 2020-10-31 LAB — POCT URINALYSIS DIPSTICK OB
Glucose, UA: NEGATIVE
POC,PROTEIN,UA: NEGATIVE

## 2020-10-31 NOTE — Progress Notes (Signed)
ROB- vaginal discharge with sour odor, leaked once yesterday (it was not urine)

## 2020-10-31 NOTE — Progress Notes (Signed)
Routine Prenatal Care Visit  Subjective  Monique Barnett is a 25 y.o. G1P0000 at [redacted]w[redacted]d being seen today for ongoing prenatal care.  She is currently monitored for the following issues for this low-risk pregnancy and has Supervision of low-risk first pregnancy, third trimester; Situational depression; Anemia in pregnancy, third trimester; Pregnancy; and Pregnancy with abdominal cramping of lower quadrant, antepartum on their problem list.  ----------------------------------------------------------------------------------- Patient reports occasional contractions and but has had some increased discharge- not itching..    .  .   Pincus Large Fluid denies.  ----------------------------------------------------------------------------------- The following portions of the patient's history were reviewed and updated as appropriate: allergies, current medications, past family history, past medical history, past social history, past surgical history and problem list. Problem list updated.  Objective  Blood pressure 130/80, weight 230 lb (104.3 kg), last menstrual period 02/16/2020, unknown if currently breastfeeding. Pregravid weight 180 lb (81.6 kg) Total Weight Gain 50 lb (22.7 kg) Urinalysis: Urine Protein Negative  Urine Glucose Negative  Fetal Status:           General:  Alert, oriented and cooperative. Patient is in no acute distress.  Skin: Skin is warm and dry. No rash noted.   Cardiovascular: Normal heart rate noted  Respiratory: Normal respiratory effort, no problems with respiration noted  Abdomen: Soft, gravid, appropriate for gestational age.       Pelvic:  Cervical exam performed       1.5cms/30%/-3 station. Cervix is quite posterior  Extremities: Normal range of motion.     Mental Status: Normal mood and affect. Normal behavior. Normal judgment and thought content.   Assessment   25 y.o. G1P0000 at [redacted]w[redacted]d by  11/22/2020, by Last Menstrual Period presenting for routine prenatal  visit  Plan   pregnancy1  Problems (from 02/16/20 to present)    Problem Noted Resolved   Anemia in pregnancy, third trimester 09/09/2020 by Monique Barnett, CNM No   Overview Signed 09/09/2020 11:24 AM by Monique Barnett, CNM    Called by phone and advised to start on an iron tab daily.      Situational depression 07/19/2020 by Monique Barnett, CNM No   Overview Signed 07/19/2020  5:35 PM by Monique Barnett, CNM    Started on Zoloft 50 mg daily 07/19/2020 due to situational stress.      Supervision of low-risk first pregnancy, third trimester 04/08/2020 by Nadara Mustard, MD No   Overview Addendum 10/31/2020  2:48 PM by Monique Barnett, CNM     Nursing Staff Provider  Office Location  [facility] Dating   LMP  Language  English Anatomy US  Detailed with MFM d/t pyelectasis  Flu Vaccine   Declines Genetic Screen  NIPS: normal XY  TDaP vaccine   declined Hgb A1C or  GTT Third trimester :123   Covid  unvaccinated Had in jan 2022   LAB RESULTS   Rhogam   not needed Blood Type O/Positive/-- (08/09 1536)   Feeding Plan  breast Antibody Negative (01/05 1014)  Contraception  IUD or nexplanon Rubella 16.20 (08/09 1536)  Circumcision  not planning RPR Non Reactive (01/05 1014)   Pediatrician   HBsAg Negative (08/09 1536)   Support Person  Monique Barnett HIV Non Reactive (01/05 1014)  Prenatal Classes  Discussed Varicella  Immune    GBS  (For PCN allergy, check sensitivities)   BTL Consent  not applicable    VBAC Consent  not applicable Pap   NIL    Hgb Electro  CF      SMA               Previous Version       Term labor symptoms and general obstetric precautions including but not limited to vaginal bleeding, contractions, leaking of fluid and fetal movement were reviewed in detail with the patient. Please refer to After Visit Summary for other counseling recommendations.  GBS and cultures retrieved today. She plans on havig her mother, and possibley , her ex BF present. She  does not want him for the entire labor and delivery-  Return in about 1 week (around 11/07/2020) for return OB.  Monique Barnett, CNM  10/31/2020 3:03 PM

## 2020-11-03 LAB — GC/CHLAMYDIA PROBE AMP
Chlamydia trachomatis, NAA: NEGATIVE
Neisseria Gonorrhoeae by PCR: NEGATIVE

## 2020-11-04 LAB — STREP GP B CULTURE+RFLX: Strep Gp B Culture+Rflx: POSITIVE — AB

## 2020-11-04 LAB — STREP GP B SUSCEPTIBILITY

## 2020-11-07 ENCOUNTER — Other Ambulatory Visit: Payer: Self-pay

## 2020-11-07 ENCOUNTER — Ambulatory Visit (INDEPENDENT_AMBULATORY_CARE_PROVIDER_SITE_OTHER): Payer: 59 | Admitting: Obstetrics and Gynecology

## 2020-11-07 ENCOUNTER — Encounter: Payer: Self-pay | Admitting: Obstetrics and Gynecology

## 2020-11-07 VITALS — BP 115/80 | Ht 64.0 in | Wt 231.4 lb

## 2020-11-07 DIAGNOSIS — Z3A37 37 weeks gestation of pregnancy: Secondary | ICD-10-CM

## 2020-11-07 DIAGNOSIS — Z3480 Encounter for supervision of other normal pregnancy, unspecified trimester: Secondary | ICD-10-CM

## 2020-11-07 LAB — POCT URINALYSIS DIPSTICK OB
Glucose, UA: NEGATIVE
POC,PROTEIN,UA: NEGATIVE

## 2020-11-07 NOTE — Progress Notes (Signed)
Routine Prenatal Care Visit  Subjective  Monique Barnett is a 25 y.o. G1P0000 at [redacted]w[redacted]d being seen today for ongoing prenatal care.  She is currently monitored for the following issues for this low-risk pregnancy and has Supervision of low-risk first pregnancy, third trimester; Situational depression; Anemia in pregnancy, third trimester; Pregnancy; and Pregnancy with abdominal cramping of lower quadrant, antepartum on their problem list.  ----------------------------------------------------------------------------------- Patient reports concern that she has been having alot of vaginal discharge, she is unsure if she is leaking fluid..   Contractions: Not present. Vag. Bleeding: None.  Movement: Present. Denies leaking of fluid.  ----------------------------------------------------------------------------------- The following portions of the patient's history were reviewed and updated as appropriate: allergies, current medications, past family history, past medical history, past social history, past surgical history and problem list. Problem list updated.   Objective  Blood pressure 115/80, height 5\' 4"  (1.626 m), weight 231 lb 6.4 oz (105 kg), last menstrual period 02/16/2020, unknown if currently breastfeeding. Pregravid weight 180 lb (81.6 kg) Total Weight Gain 51 lb 6.4 oz (23.3 kg) Urinalysis:      Fetal Status: Fetal Heart Rate (bpm): 135 Fundal Height: 38 cm Movement: Present  Presentation: Vertex  General:  Alert, oriented and cooperative. Patient is in no acute distress.  Skin: Skin is warm and dry. No rash noted.   Cardiovascular: Normal heart rate noted  Respiratory: Normal respiratory effort, no problems with respiration noted  Abdomen: Soft, gravid, appropriate for gestational age. Pain/Pressure: Absent     Pelvic:  External: Normal appearing vulva. No lesions noted.  Speculum examination: Normal appearing cervix. No pooling in vagina. No leaking of fluid at cervical os with  valsalva maneuver. No blood in the vaginal vault.   Cervical exam performed Dilation: 1.5 Effacement (%): Thick Station: -3  Extremities: Normal range of motion.  Edema: None  ental Status: Normal mood and affect. Normal behavior. Normal judgment and thought content.     Assessment   25 y.o. G1P0000 at [redacted]w[redacted]d by  11/22/2020, by Last Menstrual Period presenting for routine prenatal visit  Plan   pregnancy1  Problems (from 02/16/20 to present)    Problem Noted Resolved   Anemia in pregnancy, third trimester 09/09/2020 by 11/07/2020, CNM No   Overview Signed 09/09/2020 11:24 AM by 11/07/2020, CNM    Called by phone and advised to start on an iron tab daily.      Situational depression 07/19/2020 by 07/21/2020, CNM No   Overview Signed 07/19/2020  5:35 PM by 07/21/2020, CNM    Started on Zoloft 50 mg daily 07/19/2020 due to situational stress.      Supervision of low-risk first pregnancy, third trimester 04/08/2020 by 06/08/2020, MD No   Overview Addendum 11/04/2020 12:26 PM by 01/04/2021, CNM     Nursing Staff Provider  Office Location  [facility] Dating   LMP  Language  English Anatomy Mirna Mires  Detailed with MFM d/t pyelectasis  Flu Vaccine   Declines Genetic Screen  NIPS: normal XY  TDaP vaccine   declined Hgb A1C or  GTT Third trimester :123   Covid  unvaccinated Had in jan 2022   LAB RESULTS   Rhogam   not needed Blood Type O/Positive/-- (08/09 1536)   Feeding Plan  breast Antibody Negative (01/05 1014)  Contraception  IUD or nexplanon Rubella 16.20 (08/09 1536)  Circumcision  not planning RPR Non Reactive (01/05 1014)   Pediatrician   HBsAg Negative (  08/09 1536)   Support Person  Josue HIV Non Reactive (01/05 1014)  Prenatal Classes  Discussed Varicella  Immune    GBS  (For PCN allergy, check sensitivities) positive- PCN allergy Vancomycin 1g q 12 needed  BTL Consent  not applicable    VBAC Consent  not applicable Pap   NIL    Hgb Electro       CF      SMA               Previous Version      -Spec exam negative for SROM - no pooling of fluid, no fluid with valsalva maneuver, fern negative on microscopy. Reassured patient regarding normal changes in vaginal discharge during pregnancy.  Term labor precautions including but not limited to vaginal bleeding, contractions, leaking of fluid and fetal movement were reviewed in detail with the patient.    Return in about 1 week (around 11/14/2020) for ROB.  Zipporah Plants, CNM, MSN [facility], [facility] 11/07/2020, 4:41 PM

## 2020-11-12 ENCOUNTER — Other Ambulatory Visit: Payer: Self-pay

## 2020-11-12 ENCOUNTER — Observation Stay (HOSPITAL_BASED_OUTPATIENT_CLINIC_OR_DEPARTMENT_OTHER)
Admission: EM | Admit: 2020-11-12 | Discharge: 2020-11-12 | Disposition: A | Discharge status: Home / Self Care | Payer: 59 | Source: Home / Self Care | Admitting: Obstetrics and Gynecology

## 2020-11-12 ENCOUNTER — Encounter: Payer: Self-pay | Admitting: Obstetrics and Gynecology

## 2020-11-12 DIAGNOSIS — Z3A38 38 weeks gestation of pregnancy: Secondary | ICD-10-CM | POA: Insufficient documentation

## 2020-11-12 DIAGNOSIS — O2343 Unspecified infection of urinary tract in pregnancy, third trimester: Secondary | ICD-10-CM | POA: Insufficient documentation

## 2020-11-12 DIAGNOSIS — O163 Unspecified maternal hypertension, third trimester: Secondary | ICD-10-CM | POA: Diagnosis present

## 2020-11-12 DIAGNOSIS — O99891 Other specified diseases and conditions complicating pregnancy: Secondary | ICD-10-CM | POA: Diagnosis present

## 2020-11-12 DIAGNOSIS — O99013 Anemia complicating pregnancy, third trimester: Secondary | ICD-10-CM

## 2020-11-12 DIAGNOSIS — R3914 Feeling of incomplete bladder emptying: Secondary | ICD-10-CM | POA: Diagnosis present

## 2020-11-12 DIAGNOSIS — Z3403 Encounter for supervision of normal first pregnancy, third trimester: Secondary | ICD-10-CM

## 2020-11-12 DIAGNOSIS — F4321 Adjustment disorder with depressed mood: Secondary | ICD-10-CM

## 2020-11-12 DIAGNOSIS — M545 Low back pain, unspecified: Secondary | ICD-10-CM | POA: Insufficient documentation

## 2020-11-12 LAB — URINALYSIS, ROUTINE W REFLEX MICROSCOPIC
Bacteria, UA: NONE SEEN
Bilirubin Urine: NEGATIVE
Glucose, UA: NEGATIVE mg/dL
Hgb urine dipstick: NEGATIVE
Ketones, ur: 5 mg/dL — AB
Nitrite: NEGATIVE
Protein, ur: 100 mg/dL — AB
Specific Gravity, Urine: 1.026 (ref 1.005–1.030)
pH: 6 (ref 5.0–8.0)

## 2020-11-12 LAB — COMPREHENSIVE METABOLIC PANEL
ALT: 11 U/L (ref 0–44)
AST: 19 U/L (ref 15–41)
Albumin: 2.7 g/dL — ABNORMAL LOW (ref 3.5–5.0)
Alkaline Phosphatase: 190 U/L — ABNORMAL HIGH (ref 38–126)
Anion gap: 8 (ref 5–15)
BUN: 17 mg/dL (ref 6–20)
CO2: 20 mmol/L — ABNORMAL LOW (ref 22–32)
Calcium: 8.9 mg/dL (ref 8.9–10.3)
Chloride: 108 mmol/L (ref 98–111)
Creatinine, Ser: 0.74 mg/dL (ref 0.44–1.00)
GFR, Estimated: >60 mL/min (ref >60)
Glucose, Bld: 101 mg/dL — ABNORMAL HIGH (ref 70–99)
Potassium: 3.7 mmol/L (ref 3.5–5.1)
Sodium: 136 mmol/L (ref 135–145)
Total Bilirubin: 0.4 mg/dL (ref 0.3–1.2)
Total Protein: 6.2 g/dL — ABNORMAL LOW (ref 6.5–8.1)

## 2020-11-12 LAB — PROTEIN / CREATININE RATIO, URINE
Creatinine, Urine: 262 mg/dL
Protein Creatinine Ratio: 0.26 mg/mg{Cre} — ABNORMAL HIGH (ref 0.00–0.15)
Total Protein, Urine: 68 mg/dL

## 2020-11-12 LAB — RUPTURE OF MEMBRANE (ROM)PLUS: Rom Plus: NEGATIVE

## 2020-11-12 LAB — CBC
HCT: 28.4 % — ABNORMAL LOW (ref 36.0–46.0)
Hemoglobin: 9.3 g/dL — ABNORMAL LOW (ref 12.0–15.0)
MCH: 30.4 pg (ref 26.0–34.0)
MCHC: 32.7 g/dL (ref 30.0–36.0)
MCV: 92.8 fL (ref 80.0–100.0)
Platelets: 175 10*3/uL (ref 150–400)
RBC: 3.06 MIL/uL — ABNORMAL LOW (ref 3.87–5.11)
RDW: 15.2 % (ref 11.5–15.5)
WBC: 9.4 10*3/uL (ref 4.0–10.5)
nRBC: 0 % (ref 0.0–0.2)

## 2020-11-12 MED ORDER — ONDANSETRON 4 MG PO TBDP
4.0000 mg | ORAL_TABLET | Freq: Once | ORAL | Status: AC
  Administered 2020-11-12: 4 mg via ORAL
  Filled 2020-11-12: qty 1

## 2020-11-12 MED ORDER — CALCIUM CARBONATE ANTACID 500 MG PO CHEW
400.0000 mg | CHEWABLE_TABLET | Freq: Once | ORAL | Status: AC
  Administered 2020-11-12: 400 mg via ORAL
  Filled 2020-11-12: qty 2

## 2020-11-12 MED ORDER — ACETAMINOPHEN 325 MG PO TABS
650.0000 mg | ORAL_TABLET | ORAL | Status: DC | PRN
  Administered 2020-11-12: 650 mg via ORAL
  Filled 2020-11-12: qty 2

## 2020-11-12 MED ORDER — FOSFOMYCIN TROMETHAMINE 3 G PO PACK
3.0000 g | PACK | ORAL | Status: AC
  Administered 2020-11-12: 3 g via ORAL
  Filled 2020-11-12: qty 3

## 2020-11-12 NOTE — Final Progress Note (Signed)
Final Progress Note  Patient ID: Monique Barnett MRN: [id number] DOB/AGE: 04-17-1996 25 y.o.  Admit date: 11/12/2020 Admitting provider: Natale Milch, MD Discharge date: 11/12/2020   Admission Diagnoses: Feeling of incomplete bladder emptying Back pain affecting pregnancy Elevated blood pressure affecting pregnancy in third trimester, antepartum  Discharge Diagnoses:  Principal Problem:   Feeling of incomplete bladder emptying Active Problems:   Back pain affecting pregnancy   [redacted] weeks gestation of pregnancy   UTI (urinary tract infection) during pregnancy, third trimester   History of Present Illness: The patient is a 25 y.o. female G1P0000 at [redacted]w[redacted]d who presents with concern for feeling as if she "can't fully empty bladder." Patient reports that earlier this evening she was experiencing difficulty having a bowel movement and that following her attempts to bear down she noted mid-lower back pain, she observed her urine appeared darker than "usual" and that she felt she could not completely empty her bladder. Additionally, she noted after trying to have a bowel movement that her underwear appeared wet afterward, she is unsure if she was leaking fluid. Patient denies burning when she voids or increased frequency to urinate. Patient reports intermittent, occasional contractions which she notes every 15 to 20 minutes. She rates the discomfort associated with the contractions as 3/10 in her lower, front abdomen. She denies vaginal bleeding and reports positive fetal movement. Patient does report IC in the last 48 hours. Patient states she had recently purchased but had not started taking stool softeners. She states that she has experienced back pain like this previously and it usually resolves with position changes and tylenol.   Past Medical History:  Diagnosis Date  . Kidney stone     Past Surgical History:  Procedure Laterality Date  . MOUTH SURGERY      No current  facility-administered medications on file prior to encounter.   Current Outpatient Medications on File Prior to Encounter  Medication Sig Dispense Refill  . Prenatal Vit-Fe Fumarate-FA (PRENATAL VITAMIN) 27-0.8 MG TABS Take 1 tablet by mouth daily. OLLY PRENATAL VITAMIN - OTC    . sertraline (ZOLOFT) 50 MG tablet Take 1 tablet (50 mg total) by mouth daily. 30 tablet 2  . ferrous sulfate 325 (65 FE) MG tablet Take 325 mg by mouth once. Every other day      Allergies  Allergen Reactions  . Penicillins Nausea And Vomiting    Social History   Socioeconomic History  . Marital status: Significant Other    Spouse name: Josue  . Number of children: Not on file  . Years of education: Not on file  . Highest education level: Not on file  Occupational History  . Not on file  Tobacco Use  . Smoking status: Never Smoker  . Smokeless tobacco: Never Used  Substance and Sexual Activity  . Alcohol use: No  . Drug use: No  . Sexual activity: Yes    Partners: Male    Birth control/protection: None  Other Topics Concern  . Not on file  Social History Narrative  . Not on file   Social Determinants of Health   Financial Resource Strain: Not on file  Food Insecurity: Not on file  Transportation Needs: Not on file  Physical Activity: Not on file  Stress: Not on file  Social Connections: Not on file  Intimate Partner Violence: Not on file    Family History  Problem Relation Age of Onset  . Anxiety disorder Maternal Grandmother   . Cancer Maternal Grandfather   .  Diabetes Paternal Grandfather      Review of Systems  Constitutional: Negative.   HENT: Negative.   Eyes: Negative.  Negative for blurred vision.  Respiratory: Negative.   Cardiovascular: Negative.   Gastrointestinal: Positive for abdominal pain and constipation. Negative for nausea and vomiting.  Genitourinary:       Difficulty emptying bladder  Musculoskeletal: Positive for back pain.  Skin: Negative.    Neurological: Negative.  Negative for headaches.  Endo/Heme/Allergies: Negative.   Psychiatric/Behavioral: Negative.      Physical Exam: BP 128/89   Pulse 75   Temp 98.3 F (36.8 C) (Oral)   Resp 18   Ht 5\' 4"  (1.626 m)   Wt 104.8 kg   LMP 02/16/2020   BMI 39.65 kg/m   Physical Exam Constitutional:      Appearance: Normal appearance.  Genitourinary:     Genitourinary Comments: External: Normal appearing vulva. No lesions noted.  Speculum examination: Normal appearing cervix. No blood in the vaginal vault. Small to moderate amount of clear discharge in vaginal vault. No leaking a cervical os noted with valsalva maneuver. No pooling of fluid in the posterior vaginal fornix.  SVE per nursing: 1.5/thick/-3  HENT:     Head: Normocephalic.  Eyes:     Pupils: Pupils are equal, round, and reactive to light.  Cardiovascular:     Rate and Rhythm: Normal rate and regular rhythm.  Pulmonary:     Effort: Pulmonary effort is normal.  Abdominal:     Tenderness: There is no abdominal tenderness. There is no right CVA tenderness, left CVA tenderness, guarding or rebound.     Comments: Gravid, size c/w 38-40 week dates, non-tender  Musculoskeletal:     Cervical back: Normal range of motion.  Neurological:     General: No focal deficit present.     Mental Status: She is alert and oriented to person, place, and time.  Skin:    General: Skin is warm and dry.  Psychiatric:        Mood and Affect: Mood normal.        Behavior: Behavior normal.     Consults: None  Significant Findings/ Diagnostic Studies: labs: See results in EPIC  Procedures: NST NONSTRESS TEST INTERPRETATION  INDICATIONS: rule out uterine contractions FHR baseline: 130 bmp RESULTS:  A NST procedure was performed with FHR monitoring and a normal baseline established, appropriate time of 20-40 minutes of evaluation, and accels >2 seen w 15x15 characteristics.  Results show a REACTIVE NST.   Hospital Course: The  patient was admitted to Labor and Delivery Triage for observation. UA sent for urinary symptoms. Positive for trace LE. Will treat presumptively for UTI due to symptoms, urine culture sent. Patient noted to have single elevated BP upon admission to [facility] triage, she was normotensive through the rest of her stay. PIH labs sent, work-up negative. Pelvic exam equivocal to rule out ROM, negative ferning, no pooling of fluid in the posterior vaginal fornix. ROM+ sent and negative. Fetal status reassuring throughout stay with reactive NST. Patient discharged in stable condition with plan to keep previously scheduled ROB for 11/14/20.  Discharge Condition: good Discharge disposition: 01-Home or Self Care Diet: Regular diet Discharge Activity: Activity as tolerated   Allergies as of 11/12/2020      Reactions   Penicillins Nausea And Vomiting      Medication List    TAKE these medications   ferrous sulfate 325 (65 FE) MG tablet Take 325 mg by mouth once. Every other  day   Prenatal Vitamin 27-0.8 MG Tabs Take 1 tablet by mouth daily. OLLY PRENATAL VITAMIN - OTC   sertraline 50 MG tablet Commonly known as: Zoloft Take 1 tablet (50 mg total) by mouth daily.       Total time spent taking care of this patient: 40 minutes  Signed:  Zipporah Plants, CNM 11/12/2020, 7:29 AM

## 2020-11-12 NOTE — Discharge Summary (Signed)
See final progress note. 

## 2020-11-12 NOTE — OB Triage Note (Signed)
Pt Monique Barnett 24 y.o. presents to the ED complaining of pain in abdomen and back as well as potential leaking of fluid. Last intercourse was 11/10/2020. Pt. States that she is having difficulty emptying her bladder. She denies any burning or difficulty starting her stream when attempting to urinate. Pt is a G1P0000 at [redacted]w[redacted]d. Pt denies active vaginal bleeding. Pt denies contractions and states positive fetal movement. External FM and TOCO applied to non-tender abdomen and assessing. Initial FHR 135. Vital signs obtained and within normal limits. Provider notified of pt.  Last intercourse 11/10/2020.

## 2020-11-13 ENCOUNTER — Encounter: Payer: Self-pay | Admitting: Obstetrics and Gynecology

## 2020-11-13 ENCOUNTER — Inpatient Hospital Stay: Payer: 59 | Admitting: Anesthesiology

## 2020-11-13 ENCOUNTER — Inpatient Hospital Stay
Admission: EM | Admit: 2020-11-13 | Discharge: 2020-11-15 | DRG: 807 | Disposition: A | Discharge status: Home / Self Care | Payer: 59 | Attending: Obstetrics and Gynecology | Admitting: Obstetrics and Gynecology

## 2020-11-13 ENCOUNTER — Other Ambulatory Visit: Payer: Self-pay

## 2020-11-13 DIAGNOSIS — Z3403 Encounter for supervision of normal first pregnancy, third trimester: Secondary | ICD-10-CM

## 2020-11-13 DIAGNOSIS — O99013 Anemia complicating pregnancy, third trimester: Secondary | ICD-10-CM | POA: Diagnosis present

## 2020-11-13 DIAGNOSIS — O9902 Anemia complicating childbirth: Secondary | ICD-10-CM | POA: Diagnosis present

## 2020-11-13 DIAGNOSIS — O139 Gestational [pregnancy-induced] hypertension without significant proteinuria, unspecified trimester: Secondary | ICD-10-CM | POA: Diagnosis present

## 2020-11-13 DIAGNOSIS — O26893 Other specified pregnancy related conditions, third trimester: Secondary | ICD-10-CM | POA: Diagnosis present

## 2020-11-13 DIAGNOSIS — O99344 Other mental disorders complicating childbirth: Secondary | ICD-10-CM | POA: Diagnosis present

## 2020-11-13 DIAGNOSIS — O99824 Streptococcus B carrier state complicating childbirth: Secondary | ICD-10-CM | POA: Diagnosis present

## 2020-11-13 DIAGNOSIS — Z3A38 38 weeks gestation of pregnancy: Secondary | ICD-10-CM | POA: Diagnosis not present

## 2020-11-13 DIAGNOSIS — O134 Gestational [pregnancy-induced] hypertension without significant proteinuria, complicating childbirth: Principal | ICD-10-CM | POA: Diagnosis present

## 2020-11-13 DIAGNOSIS — O133 Gestational [pregnancy-induced] hypertension without significant proteinuria, third trimester: Secondary | ICD-10-CM

## 2020-11-13 DIAGNOSIS — F4321 Adjustment disorder with depressed mood: Secondary | ICD-10-CM | POA: Diagnosis present

## 2020-11-13 DIAGNOSIS — Z880 Allergy status to penicillin: Secondary | ICD-10-CM

## 2020-11-13 LAB — PROTEIN / CREATININE RATIO, URINE
Creatinine, Urine: 33 mg/dL
Protein Creatinine Ratio: 0.24 mg/mg{Cre} — ABNORMAL HIGH (ref 0.00–0.15)
Total Protein, Urine: 8 mg/dL

## 2020-11-13 LAB — COMPREHENSIVE METABOLIC PANEL
ALT: 12 U/L (ref 0–44)
AST: 17 U/L (ref 15–41)
Albumin: 3 g/dL — ABNORMAL LOW (ref 3.5–5.0)
Alkaline Phosphatase: 211 U/L — ABNORMAL HIGH (ref 38–126)
Anion gap: 7 (ref 5–15)
BUN: 11 mg/dL (ref 6–20)
CO2: 24 mmol/L (ref 22–32)
Calcium: 9.5 mg/dL (ref 8.9–10.3)
Chloride: 106 mmol/L (ref 98–111)
Creatinine, Ser: 0.69 mg/dL (ref 0.44–1.00)
GFR, Estimated: >60 mL/min (ref >60)
Glucose, Bld: 82 mg/dL (ref 70–99)
Potassium: 3.7 mmol/L (ref 3.5–5.1)
Sodium: 137 mmol/L (ref 135–145)
Total Bilirubin: 0.5 mg/dL (ref 0.3–1.2)
Total Protein: 6.6 g/dL (ref 6.5–8.1)

## 2020-11-13 LAB — URINE CULTURE

## 2020-11-13 LAB — TYPE AND SCREEN
ABO/RH(D): O POS
Antibody Screen: NEGATIVE

## 2020-11-13 LAB — CBC
HCT: 31.2 % — ABNORMAL LOW (ref 36.0–46.0)
Hemoglobin: 10.2 g/dL — ABNORMAL LOW (ref 12.0–15.0)
MCH: 30.3 pg (ref 26.0–34.0)
MCHC: 32.7 g/dL (ref 30.0–36.0)
MCV: 92.6 fL (ref 80.0–100.0)
Platelets: 182 10*3/uL (ref 150–400)
RBC: 3.37 MIL/uL — ABNORMAL LOW (ref 3.87–5.11)
RDW: 14.9 % (ref 11.5–15.5)
WBC: 8.7 10*3/uL (ref 4.0–10.5)
nRBC: 0 % (ref 0.0–0.2)

## 2020-11-13 LAB — ABO/RH: ABO/RH(D): O POS

## 2020-11-13 MED ORDER — ACETAMINOPHEN 325 MG PO TABS: 650.0000 mg | ORAL_TABLET | ORAL | Status: DC | PRN

## 2020-11-13 MED ORDER — BUPIVACAINE HCL (PF) 0.25 % IJ SOLN
INTRAMUSCULAR | Status: DC | PRN
  Administered 2020-11-13: 2 mL via EPIDURAL
  Administered 2020-11-13: 3 mL via EPIDURAL

## 2020-11-13 MED ORDER — ONDANSETRON HCL 4 MG/2ML IJ SOLN
4.0000 mg | Freq: Four times a day (QID) | INTRAMUSCULAR | Status: DC | PRN
  Administered 2020-11-13: 4 mg via INTRAVENOUS
  Filled 2020-11-13: qty 2

## 2020-11-13 MED ORDER — OXYTOCIN BOLUS FROM INFUSION
333.0000 mL | Freq: Once | INTRAVENOUS | Status: AC
  Administered 2020-11-14: 333 mL via INTRAVENOUS

## 2020-11-13 MED ORDER — PHENYLEPHRINE 40 MCG/ML (10ML) SYRINGE FOR IV PUSH (FOR BLOOD PRESSURE SUPPORT): 80.0000 ug | PREFILLED_SYRINGE | INTRAVENOUS | Status: DC | PRN

## 2020-11-13 MED ORDER — FENTANYL 2.5 MCG/ML W/ROPIVACAINE 0.15% IN NS 100 ML EPIDURAL (ARMC)
EPIDURAL | Status: AC
  Filled 2020-11-13: qty 100

## 2020-11-13 MED ORDER — FLEET ENEMA 7-19 GM/118ML RE ENEM: 1.0000 | ENEMA | RECTAL | Status: DC | PRN

## 2020-11-13 MED ORDER — OXYTOCIN-SODIUM CHLORIDE 30-0.9 UT/500ML-% IV SOLN
1.0000 m[IU]/min | INTRAVENOUS | Status: DC
  Administered 2020-11-13: 2 m[IU]/min via INTRAVENOUS

## 2020-11-13 MED ORDER — OXYTOCIN-SODIUM CHLORIDE 30-0.9 UT/500ML-% IV SOLN
2.5000 [IU]/h | INTRAVENOUS | Status: DC
  Administered 2020-11-14: 2.5 [IU]/h via INTRAVENOUS

## 2020-11-13 MED ORDER — CLINDAMYCIN PHOSPHATE 900 MG/50ML IV SOLN
900.0000 mg | Freq: Three times a day (TID) | INTRAVENOUS | Status: DC
  Administered 2020-11-13 – 2020-11-14 (×2): 900 mg via INTRAVENOUS
  Filled 2020-11-13 (×2): qty 50

## 2020-11-13 MED ORDER — EPHEDRINE 5 MG/ML INJ: 10.0000 mg | INTRAVENOUS | Status: DC | PRN

## 2020-11-13 MED ORDER — LIDOCAINE-EPINEPHRINE (PF) 1.5 %-1:200000 IJ SOLN
INTRAMUSCULAR | Status: DC | PRN
  Administered 2020-11-13: 3 mL via PERINEURAL

## 2020-11-13 MED ORDER — LIDOCAINE HCL (PF) 1 % IJ SOLN
INTRAMUSCULAR | Status: DC | PRN
  Administered 2020-11-13: 3 mL

## 2020-11-13 MED ORDER — OXYTOCIN-SODIUM CHLORIDE 30-0.9 UT/500ML-% IV SOLN
INTRAVENOUS | Status: AC
  Filled 2020-11-13: qty 1000

## 2020-11-13 MED ORDER — MISOPROSTOL 25 MCG QUARTER TABLET
25.0000 ug | ORAL_TABLET | ORAL | Status: DC | PRN
  Filled 2020-11-13: qty 1

## 2020-11-13 MED ORDER — PRENATAL MULTIVITAMIN CH: 1.0000 | ORAL_TABLET | Freq: Every day | ORAL | Status: DC

## 2020-11-13 MED ORDER — LACTATED RINGERS IV SOLN: 500.0000 mL | INTRAVENOUS | Status: DC | PRN

## 2020-11-13 MED ORDER — LIDOCAINE HCL (PF) 1 % IJ SOLN
INTRAMUSCULAR | Status: AC
  Filled 2020-11-13: qty 30

## 2020-11-13 MED ORDER — TERBUTALINE SULFATE 1 MG/ML IJ SOLN: 0.2500 mg | Freq: Once | INTRAMUSCULAR | Status: DC | PRN

## 2020-11-13 MED ORDER — LACTATED RINGERS IV SOLN: INTRAVENOUS | Status: DC

## 2020-11-13 MED ORDER — ZOLPIDEM TARTRATE 5 MG PO TABS: 5.0000 mg | ORAL_TABLET | Freq: Every evening | ORAL | Status: DC | PRN

## 2020-11-13 MED ORDER — SOD CITRATE-CITRIC ACID 500-334 MG/5ML PO SOLN
30.0000 mL | ORAL | Status: DC | PRN
Start: 2020-11-13 — End: 2020-11-14

## 2020-11-13 MED ORDER — FENTANYL 2.5 MCG/ML W/ROPIVACAINE 0.15% IN NS 100 ML EPIDURAL (ARMC)
12.0000 mL/h | EPIDURAL | Status: DC
Start: 2020-11-13 — End: 2020-11-14
  Administered 2020-11-13: 12 mL/h via EPIDURAL
  Filled 2020-11-13 (×2): qty 100

## 2020-11-13 MED ORDER — LACTATED RINGERS IV SOLN
500.0000 mL | Freq: Once | INTRAVENOUS | Status: AC
  Administered 2020-11-13: 500 mL via INTRAVENOUS

## 2020-11-13 MED ORDER — MISOPROSTOL 200 MCG PO TABS
ORAL_TABLET | ORAL | Status: AC
  Filled 2020-11-13: qty 4

## 2020-11-13 MED ORDER — OXYTOCIN 10 UNIT/ML IJ SOLN
INTRAMUSCULAR | Status: AC
  Filled 2020-11-13: qty 2

## 2020-11-13 MED ORDER — DOCUSATE SODIUM 100 MG PO CAPS: 100.0000 mg | ORAL_CAPSULE | Freq: Every day | ORAL | Status: DC

## 2020-11-13 MED ORDER — DIPHENHYDRAMINE HCL 50 MG/ML IJ SOLN: 12.5000 mg | INTRAMUSCULAR | Status: DC | PRN

## 2020-11-13 MED ORDER — CALCIUM CARBONATE ANTACID 500 MG PO CHEW: 2.0000 | CHEWABLE_TABLET | ORAL | Status: DC | PRN

## 2020-11-13 MED ORDER — AMMONIA AROMATIC IN INHA
RESPIRATORY_TRACT | Status: AC
  Filled 2020-11-13: qty 10

## 2020-11-13 MED ORDER — LIDOCAINE HCL (PF) 1 % IJ SOLN: 30.0000 mL | INTRAMUSCULAR | Status: DC | PRN

## 2020-11-13 NOTE — H&P (Signed)
Obstetric H&P   Chief Complaint: Contractions  Prenatal Care Provider: [facility]  History of Present Illness: 25 y.o. G1P0000 [redacted]w[redacted]d by 11/22/2020, by Last Menstrual Period (consistent with 9w [place]) presented to L&D with concern for contractions which she first noted at midnight last night. Patient states the contractions have intensified in discomfort, she now breaths through them. She additionally reports noting a mucous discharge and bloody discharge with wiping. Patient currently denies S&S of PIH including RUQ pain, persistent headache, or swelling. She does not feeling dizzy this morning. Patient reports +FM, denies LOF.   Pregravid weight 81.6 kg Total Weight Gain 23.1 kg  pregnancy1  Problems (from 02/16/20 to present)    Problem Noted Resolved   Anemia in pregnancy, third trimester 09/09/2020 by Mirna Mires, CNM No   Overview Signed 09/09/2020 11:24 AM by Mirna Mires, CNM    Called by phone and advised to start on an iron tab daily.      Situational depression 07/19/2020 by Mirna Mires, CNM No   Overview Signed 07/19/2020  5:35 PM by Mirna Mires, CNM    Started on Zoloft 50 mg daily 07/19/2020 due to situational stress.      Supervision of low-risk first pregnancy, third trimester 04/08/2020 by Nadara Mustard, MD No   Overview Addendum 11/04/2020 12:26 PM by Mirna Mires, CNM     Nursing Staff Provider  Office Location  [facility] Dating   LMP  Language  English Anatomy US  Detailed with MFM d/t pyelectasis  Flu Vaccine   Declines Genetic Screen  NIPS: normal XY  TDaP vaccine   declined Hgb A1C or  GTT Third trimester :123   Covid  unvaccinated Had in jan 2022   LAB RESULTS   Rhogam   not needed Blood Type O/Positive/-- (08/09 1536)   Feeding Plan  breast Antibody Negative (01/05 1014)  Contraception  IUD or nexplanon Rubella 16.20 (08/09 1536)  Circumcision  not planning RPR Non Reactive (01/05 1014)   Pediatrician   HBsAg Negative (08/09  1536)   Support Person  Josue HIV Non Reactive (01/05 1014)  Prenatal Classes  Discussed Varicella  Immune    GBS  (For PCN allergy, check sensitivities) positive- PCN allergy Vancomycin 1g q 12 needed  BTL Consent  not applicable    VBAC Consent  not applicable Pap   NIL    Hgb Electro      CF      SMA               Previous Version       Review of Systems: 10 point review of systems negative unless otherwise noted in HPI  Past Medical History: Patient Active Problem List   Diagnosis Date Noted  . Gestational hypertension 11/13/2020  . Back pain affecting pregnancy 11/12/2020  . [redacted] weeks gestation of pregnancy 11/12/2020  . UTI (urinary tract infection) during pregnancy, third trimester 11/12/2020  . Pregnancy 10/03/2020  . Pregnancy with abdominal cramping of lower quadrant, antepartum 10/03/2020  . Anemia in pregnancy, third trimester 09/09/2020    Called by phone and advised to start on an iron tab daily.   . Situational depression 07/19/2020    Started on Zoloft 50 mg daily 07/19/2020 due to situational stress.   . Supervision of low-risk first pregnancy, third trimester 04/08/2020     Nursing Staff Provider  Office Location  [facility] Dating   LMP  Language  English Anatomy US  Detailed  with MFM d/t pyelectasis  Flu Vaccine   Declines Genetic Screen  NIPS: normal XY  TDaP vaccine   declined Hgb A1C or  GTT Third trimester :123   Covid  unvaccinated Had in jan 2022   LAB RESULTS   Rhogam   not needed Blood Type O/Positive/-- (08/09 1536)   Feeding Plan  breast Antibody Negative (01/05 1014)  Contraception  IUD or nexplanon Rubella 16.20 (08/09 1536)  Circumcision  not planning RPR Non Reactive (01/05 1014)   Pediatrician   HBsAg Negative (08/09 1536)   Support Person  Josue HIV Non Reactive (01/05 1014)  Prenatal Classes  Discussed Varicella  Immune    GBS  (For PCN allergy, check sensitivities) positive- PCN allergy Vancomycin 1g q 12 needed  BTL Consent  not  applicable    VBAC Consent  not applicable Pap   NIL    Hgb Electro      CF      SMA              Past Surgical History: Past Surgical History:  Procedure Laterality Date  . MOUTH SURGERY      Past Obstetric History: # 1 - Date: None, Sex: None, Weight: None, GA: None, Delivery: None, Apgar1: None, Apgar5: None, Living: None, Birth Comments: None   Past Gynecologic History:  Family History: Family History  Problem Relation Age of Onset  . Anxiety disorder Maternal Grandmother   . Cancer Maternal Grandfather   . Diabetes Paternal Grandfather     Social History: Social History   Socioeconomic History  . Marital status: Significant Other    Spouse name: Josue  . Number of children: Not on file  . Years of education: Not on file  . Highest education level: Not on file  Occupational History  . Not on file  Tobacco Use  . Smoking status: Never Smoker  . Smokeless tobacco: Never Used  Substance and Sexual Activity  . Alcohol use: No  . Drug use: No  . Sexual activity: Yes    Partners: Male    Birth control/protection: I.U.D.  Other Topics Concern  . Not on file  Social History Narrative  . Not on file   Social Determinants of Health   Financial Resource Strain: Not on file  Food Insecurity: Not on file  Transportation Needs: Not on file  Physical Activity: Not on file  Stress: Not on file  Social Connections: Not on file  Intimate Partner Violence: Not on file    Medications: Prior to Admission medications   Medication Sig Start Date End Date Taking? Authorizing Provider  ferrous sulfate 325 (65 FE) MG tablet Take 325 mg by mouth once. Every other day   Yes [provider]  Prenatal Vit-Fe Fumarate-FA (PRENATAL VITAMIN) 27-0.8 MG TABS Take 1 tablet by mouth daily. OLLY PRENATAL VITAMIN - OTC   Yes [provider]  sertraline (ZOLOFT) 50 MG tablet Take 1 tablet (50 mg total) by mouth daily. 07/19/20  Yes Mirna Mires, CNM     Allergies: Allergies  Allergen Reactions  . Penicillins Nausea And Vomiting    Physical Exam: Vitals: Blood pressure 138/87, pulse 76, temperature 98 F (36.7 C), temperature source Oral, resp. rate 18, height 5\' 4"  (1.626 m), weight 104.8 kg, last menstrual period 02/16/2020, unknown if currently breastfeeding.   FHT: 125 bmp, moderate variability, +accels, no decels ntoed Toco: irregular contractions 5-6 mins - palpate mild   General: NAD HEENT: normocephalic, anicteric Pulmonary: No increased  work of breathing Cardiovascular: RRR, distal pulses 2+ Abdomen: Gravid, non-tender Leopolds: vtx, EFW: 7.5lbs Genitourinary: SVE - 1.5/50/-3/posterior/vtx Extremities: no edema, erythema, or tenderness Neurologic: Grossly intact Psychiatric: mood appropriate, affect full  Labs: Results for orders placed or performed during the hospital encounter of 11/13/20 (from the past 24 hour(s))  Protein / creatinine ratio, urine     Status: Abnormal   Collection Time: 11/13/20 11:53 AM  Result Value Ref Range   Creatinine, Urine 33 mg/dL   Total Protein, Urine 8 mg/dL   Protein Creatinine Ratio 0.24 (H) 0.00 - 0.15 mg/mg[Cre]    Assessment: 25 y.o. G1P0000 [redacted]w[redacted]d by 11/22/2020, by Last Menstrual Period (c/w with 9w [place]). Patient presented with irregular uterine contractions for labor eval. Elevated blood pressures noted during evaluation, patient now with gestational hypertension.   Plan: 1) Gestational hypertension - reviewed recommendation for timing of delivery with patient. Will proceed with IOL.  Patient has been fully informed of the pros and cons, risks and benefits of continued observation with fetal monitoring versus that of induction of labor.   She understands that there are uncommon risks to induction, which include but are not limited to : frequent or prolonged uterine contractions, fetal distress, uterine rupture, and lack of successful induction.  These risks include all  methods including Pitocin and Misoprostol.  Patient understands that using Misoprostol for labor induction is an "off label" indication although it has been studied extensively for this purpose and is an accepted method of induction.  She also has been informed of the increased risks for Cesarean with induction and should induction not be successful.  Patient consents to the induction plan of management.  2) Fetus - Reassuring: Category I tracing.  3) PNL - Blood type O/Positive/-- (08/09 1536) / Anti-bodyscreen Negative (01/05 1014) / Rubella 16.20 (08/09 1536) / Varicella immune / RPR Non Reactive (01/05 1014) / HBsAg Negative (08/09 1536) / HIV Non Reactive (01/05 1014) / 1-hr OGTT 123 / GBS Positive/-- (03/03 1504)  4) GBS positive - penicillin allergy - clindamycin Rx'd  4) Immunization History - patient declined Tdap antepartum  5) Disposition - IOL, anticipate NSVD  Zipporah Plants, CNM, MSN [facility], [facility] 11/13/2020, 1:00 PM

## 2020-11-13 NOTE — Anesthesia Procedure Notes (Signed)
Epidural Patient location during procedure: OB Start time: 11/13/2020 6:00 PM End time: 11/13/2020 6:24 PM  Staffing Performed: resident/CRNA   Preanesthetic Checklist Completed: patient identified, IV checked, site marked, risks and benefits discussed, surgical consent, monitors and equipment checked, pre-op evaluation and timeout performed  Epidural Patient position: sitting Prep: ChloraPrep Patient monitoring: heart rate Approach: midline Location: L3-L4 Injection technique: LOR saline  Needle:  Needle type: Tuohy  Needle gauge: 18 G Needle length: 9 cm Needle insertion depth: 9 cm Catheter type: closed end flexible Catheter size: 20 Guage Catheter at skin depth: 14 cm Test dose: negative and 1.5% lidocaine with Epi 1:200 K  Assessment Events: blood not aspirated, injection not painful, no injection resistance, no paresthesia and negative IV test  Additional Notes Reason for block:at surgeon's request and procedure for pain

## 2020-11-13 NOTE — OB Triage Note (Signed)
Pt Monique Barnett 24 y.o. presents to the ED complaining of ctx and lost mucus plug. Pt is a G1P0000 at [redacted]w[redacted]d . Pt denies signs and symptons consistent with rupture of membranes or active vaginal bleeding. Pt reports contractions that have started at midnight and have gotten more frequent and intense. Pt reports ctx q5 min and lasting 1 minute. Pt states positive fetal movement but has decreased over the last few hours. External FM and TOCO applied to non-tender abdomen and assessing. Initial FHR 135 . Vital signs obtained and within normal limits. Provider notified of pt.

## 2020-11-13 NOTE — Progress Notes (Signed)
Labor Check  Subj:  Complaints: comfortable with epidural, reports rupture of membranes   Obj:  BP 134/84 (BP Location: Left Arm)   Pulse 72   Temp 98 F (36.7 C) (Oral)   Resp 16   Ht 5\' 4"  (1.626 m)   Wt 104.8 kg   LMP 02/16/2020   SpO2 91%   BMI 39.65 kg/m  Dose (milli-units/min) Oxytocin: 10 milli-units/min  Cervix: Dilation: 4.5 / Effacement (%): 80 / Station: -1,0   Fluid on perineum consistent with SROM, evidence of mild meconium.  Baseline FHR: 125 beats/min   Variability: moderate   Accelerations: present   Decelerations: present down to 60s-70s with quick return to baseline and moderate variability Contractions: present frequency: 3 q 10 min Overall assessment: cat 2 currently. Overall, category 1.   Female chaperone present for pelvic exam:   A/P: 25 y.o. G1P0000 female at [redacted]w[redacted]d with gestational hypertension.  1.  Labor: continue pitocin per protocol.  Now SROM.  Add IUPC if variables continue  2.  FWB: reassuring overall, Overall assessment: category 2. Make adjustments as needed to return to category 1 FHR.  3.  GBS positive. Receiving clindamycin. Dose #2 due at 0100  4.  Pain: epidural 5.  Recheck: as needed.    [redacted]w[redacted]d, MD, [facility], [facility] 11/13/2020 9:25 PM

## 2020-11-13 NOTE — Progress Notes (Signed)
Labor Progress Note  Monique Barnett is a 25 y.o. G1P0000 at [redacted]w[redacted]d by LMP admitted for induction of labor due to gestational hypertension.  Subjective: Patient is resting in bed. Recently completed enema for constipation over the last 3-4 days. Patient reports good effect from enema.   Objective: BP 135/88 (BP Location: Left Arm)   Pulse 85   Temp 98 F (36.7 C) (Oral)   Resp 18   Ht 5\' 4"  (1.626 m)   Wt 104.8 kg   LMP 02/16/2020   BMI 39.65 kg/m  Notable VS details: currently wnl - previously elevated BP  Fetal Assessment: FHT:  FHR: 135 bpm, variability: moderate,  accelerations:  Present,  decelerations:  Absent Category/reactivity:  Category I UC:   irregular, every 3-5 minutes - palpate mild to moderate SVE:   2/70/-3  Membrane status: intact Amniotic color: clear  Labs: Lab Results  Component Value Date   WBC 8.7 11/13/2020   HGB 10.2 (L) 11/13/2020   HCT 31.2 (L) 11/13/2020   MCV 92.6 11/13/2020   PLT 182 11/13/2020    Assessment / Plan: Induction of labor due to gestational hypertension - foley bulb now placed. Given effacement and contraction pattern will initiate pitocin infusion.  Labor: Will initiate pitocin, foley bulb placed Preeclampsia: gestational hypertension - labs stable Fetal Wellbeing:  Category I Pain Control:  Labor support without medications and plan for epidural as labor progresses I/D:  GBS +, clindamycin ordered Anticipated MOD:  NSVD  11/15/2020, CNM 11/13/2020, 3:31 PM

## 2020-11-13 NOTE — Anesthesia Preprocedure Evaluation (Signed)
Anesthesia Evaluation  Patient identified by MRN, date of birth, ID band Patient awake    History of Anesthesia Complications Negative for: history of anesthetic complications  Airway Mallampati: II  TM Distance: >3 FB Neck ROM: Full    Dental  (+) Teeth Intact   Pulmonary neg pulmonary ROS,    Pulmonary exam normal        Cardiovascular hypertension,  Rhythm:Regular Rate:Normal     Neuro/Psych Depression negative neurological ROS     GI/Hepatic   Endo/Other  negative endocrine ROS  Renal/GU Renal diseaseHistory of kidney stones  negative genitourinary   Musculoskeletal negative musculoskeletal ROS (+)   Abdominal   Peds negative pediatric ROS (+)  Hematology  (+) anemia ,   Anesthesia Other Findings   Reproductive/Obstetrics (+) Pregnancy                             Anesthesia Physical Anesthesia Plan  ASA: II  Anesthesia Plan: Epidural   Post-op Pain Management:    Induction:   PONV Risk Score and Plan:   Airway Management Planned:   Additional Equipment:   Intra-op Plan:   Post-operative Plan:   Informed Consent: I have reviewed the patients History and Physical, chart, labs and discussed the procedure including the risks, benefits and alternatives for the proposed anesthesia with the patient or authorized representative who has indicated his/her understanding and acceptance.       Plan Discussed with: Anesthesiologist  Anesthesia Plan Comments:         Anesthesia Quick Evaluation

## 2020-11-14 ENCOUNTER — Telehealth: Payer: Self-pay

## 2020-11-14 ENCOUNTER — Encounter: Payer: Self-pay | Admitting: Obstetrics and Gynecology

## 2020-11-14 ENCOUNTER — Encounter: Payer: 59 | Admitting: Obstetrics

## 2020-11-14 DIAGNOSIS — O134 Gestational [pregnancy-induced] hypertension without significant proteinuria, complicating childbirth: Principal | ICD-10-CM

## 2020-11-14 DIAGNOSIS — Z3A38 38 weeks gestation of pregnancy: Secondary | ICD-10-CM

## 2020-11-14 LAB — RPR: RPR Ser Ql: NONREACTIVE

## 2020-11-14 MED ORDER — DIBUCAINE (PERIANAL) 1 % EX OINT: 1.0000 "application " | TOPICAL_OINTMENT | CUTANEOUS | Status: DC | PRN

## 2020-11-14 MED ORDER — COCONUT OIL OIL: 1.0000 "application " | TOPICAL_OIL | Status: DC | PRN

## 2020-11-14 MED ORDER — HYDROCODONE-ACETAMINOPHEN 5-325 MG PO TABS: 1.0000 | ORAL_TABLET | Freq: Four times a day (QID) | ORAL | Status: DC | PRN

## 2020-11-14 MED ORDER — PRENATAL MULTIVITAMIN CH
1.0000 | ORAL_TABLET | Freq: Every day | ORAL | Status: DC
  Administered 2020-11-14: 1 via ORAL
  Filled 2020-11-14: qty 1

## 2020-11-14 MED ORDER — WITCH HAZEL-GLYCERIN EX PADS: 1.0000 "application " | MEDICATED_PAD | CUTANEOUS | Status: DC | PRN

## 2020-11-14 MED ORDER — SENNOSIDES-DOCUSATE SODIUM 8.6-50 MG PO TABS
2.0000 | ORAL_TABLET | ORAL | Status: DC
  Administered 2020-11-14: 2 via ORAL
  Filled 2020-11-14: qty 2

## 2020-11-14 MED ORDER — ACETAMINOPHEN 325 MG PO TABS
650.0000 mg | ORAL_TABLET | ORAL | Status: DC | PRN
  Administered 2020-11-14: 650 mg via ORAL

## 2020-11-14 MED ORDER — IBUPROFEN 600 MG PO TABS
600.0000 mg | ORAL_TABLET | Freq: Four times a day (QID) | ORAL | Status: DC
  Administered 2020-11-14 – 2020-11-15 (×3): 600 mg via ORAL
  Filled 2020-11-14 (×4): qty 1

## 2020-11-14 MED ORDER — DIPHENHYDRAMINE HCL 25 MG PO CAPS: 25.0000 mg | ORAL_CAPSULE | Freq: Four times a day (QID) | ORAL | Status: DC | PRN

## 2020-11-14 MED ORDER — SERTRALINE HCL 25 MG PO TABS
25.0000 mg | ORAL_TABLET | Freq: Every day | ORAL | Status: DC
  Administered 2020-11-14: 25 mg via ORAL
  Filled 2020-11-14: qty 1

## 2020-11-14 MED ORDER — SIMETHICONE 80 MG PO CHEW: 80.0000 mg | CHEWABLE_TABLET | ORAL | Status: DC | PRN

## 2020-11-14 MED ORDER — IBUPROFEN 600 MG PO TABS
ORAL_TABLET | ORAL | Status: AC
  Administered 2020-11-14: 600 mg via ORAL
  Filled 2020-11-14: qty 1

## 2020-11-14 MED ORDER — ONDANSETRON HCL 4 MG PO TABS: 4.0000 mg | ORAL_TABLET | ORAL | Status: DC | PRN

## 2020-11-14 MED ORDER — BENZOCAINE-MENTHOL 20-0.5 % EX AERO: 1.0000 "application " | INHALATION_SPRAY | CUTANEOUS | Status: DC | PRN

## 2020-11-14 MED ORDER — ONDANSETRON HCL 4 MG/2ML IJ SOLN: 4.0000 mg | INTRAMUSCULAR | Status: DC | PRN

## 2020-11-14 MED ORDER — ACETAMINOPHEN 325 MG PO TABS
ORAL_TABLET | ORAL | Status: AC
  Filled 2020-11-14: qty 2

## 2020-11-14 MED ORDER — FERROUS SULFATE 325 (65 FE) MG PO TABS
325.0000 mg | ORAL_TABLET | Freq: Two times a day (BID) | ORAL | Status: DC
  Administered 2020-11-14 – 2020-11-15 (×2): 325 mg via ORAL
  Filled 2020-11-14 (×2): qty 1

## 2020-11-14 NOTE — Lactation Note (Signed)
This note was copied from a baby's chart. Lactation Consultation Note  Patient Name: Monique Barnett [facility] LSLHT'D Date: 11/14/2020 Reason for consult: Follow-up assessment;1st time breastfeeding;Early term 37-38.6wks;Other (Comment) (sleepy) Age:26 hours  Lactation at bedside on MBU for 2 attempted feedings, baby sleeping, uninterested. LC assisted with and taught hand expression into white spoon. Mom demonstrated/taught back and able to obtain drops on her own. Approximately 41mL expressed and given via spoon. Baby did wake up, multiple attempts made to sustain latch; baby on/off, pushing and fussy- several burps heard throughout time at the breast. After 5 minutes LC moved baby onto chest skin to skin with mom where he was consoled easily.  LC encouraged to parents consistent attempts with feeding, every 2-4 hours, removing blanket, checking diaper, hand express, and offering of the breast. Reviewed early cues, feeding on demand, impact that spitty/gaggy/retaining fluid may have on his desire to eat.  Encouraged to call out for support as needed.  Maternal Data Has patient been taught Hand Expression?: Yes Does the patient have breastfeeding experience prior to this delivery?: No  Feeding Mother's Current Feeding Choice: Breast Milk  LATCH Score Latch: Repeated attempts needed to sustain latch, nipple held in mouth throughout feeding, stimulation needed to elicit sucking reflex.  Audible Swallowing: None  Type of Nipple: Everted at rest and after stimulation  Comfort (Breast/Nipple): Soft / non-tender  Hold (Positioning): Assistance needed to correctly position infant at breast and maintain latch.  LATCH Score: 6   Lactation Tools Discussed/Used    Interventions Interventions: Breast feeding basics reviewed;Assisted with latch;Hand express;Breast compression;Adjust position;Support pillows;Education (spoon fed)  Discharge    Consult Status Consult Status: Follow-up Date:  11/15/20 Follow-up type: In-patient    Danford Bad 11/14/2020, 2:41 PM

## 2020-11-14 NOTE — Discharge Summary (Shared)
Postpartum Discharge Summary    Patient Name: Monique Barnett DOB: 06/01/1996 MRN: [id number]  Date of admission: 11/13/2020 Delivery date:11/14/2020  Delivering provider: Prentice Docker D  Date of discharge: 11/15/2020  Admitting diagnosis: Gestational hypertension [O13.9] Intrauterine pregnancy: [redacted]w[redacted]d    Secondary diagnosis:  Active Problems:   Supervision of low-risk first pregnancy, third trimester   Anemia in pregnancy, third trimester   [redacted] weeks gestation of pregnancy   Gestational hypertension   Postpartum care following vaginal delivery   Encounter for care or examination of lactating mother  Additional problems: none    Discharge diagnosis: Term Pregnancy Delivered and Gestational Hypertension                                              Post partum procedures: none Augmentation: Pitocin and Cytotec Complications: None  Hospital course: Induction of Labor With Vaginal Delivery   25y.o. yo G1P1001 at 364w6das admitted to the hospital 11/13/2020 for induction of labor.  Indication for induction: Gestational hypertension.  Patient had an uncomplicated labor course as follows: Membrane Rupture Time/Date: 9:15 PM ,11/13/2020   Delivery Method:Vaginal, Spontaneous  Episiotomy: None  Lacerations:  2nd degree;Vaginal  Details of delivery can be found in separate delivery note.  Patient had a routine postpartum course. Patient is discharged home 11/15/20.  Newborn Data: Birth date:11/14/2020  Birth time:7:27 AM  Gender:Female  Living status:Living  Apgars:6 ,7  Weight:3150 g   Magnesium Sulfate received: No BMZ received: No Rhophylac:N/A MMR:No T-DaP: declined Flu: No Transfusion:No  Physical exam  Vitals:   11/14/20 1144 11/14/20 1925 11/14/20 2332 11/15/20 0906  BP: 126/73 128/88 133/75 138/85  Pulse: 74 73 70 100  Resp: _0 Temp: 97.9 F (36.6 C) 97.8 F (36.6 C) 98.2 F (36.8 C) 98 F (36.7 C)  TempSrc: Oral Oral Oral Oral  SpO2: 97% 92%  100% 100%  Weight:      Height:       Patient Vitals for the past 24 hrs:  BP Temp Temp src Pulse Resp SpO2  11/15/20 0906 138/85 98 F (36.7 C) Oral 100 20 100 %  11/14/20 2332 133/75 98.2 F (36.8 C) Oral 70 20 100 %  11/14/20 1925 128/88 97.8 F (36.6 C) Oral 73 18 92 %  11/14/20 1144 126/73 97.9 F (36.6 C) Oral 74 18 97 %  11/14/20 1042 126/77 98.3 F (36.8 C) Oral 76 18 98 %  11/14/20 0959 130/71 - - 84 - -    General: alert, cooperative and no distress Lochia: appropriate Uterine Fundus: firm Incision: N/A DVT Evaluation: No evidence of DVT seen on physical exam.  Labs: Lab Results  Component Value Date   WBC 11.8 (H) 11/15/2020   HGB 8.5 (L) 11/15/2020   HCT 25.5 (L) 11/15/2020   MCV 93.4 11/15/2020   PLT 168 11/15/2020   CMP Latest Ref Rng & Units 11/13/2020  Glucose 70 - 99 mg/dL 82  BUN 6 - 20 mg/dL 11  Creatinine 0.44 - 1.00 mg/dL 0.69  Sodium 135 - 145 mmol/L 137  Potassium 3.5 - 5.1 mmol/L 3.7  Chloride 98 - 111 mmol/L 106  CO2 22 - 32 mmol/L 24  Calcium 8.9 - 10.3 mg/dL 9.5  Total Protein 6.5 - 8.1 g/dL 6.6  Total Bilirubin 0.3 - 1.2 mg/dL 0.5  Alkaline Phos  38 - 126 U/L 211(H)  AST 15 - 41 U/L 17  ALT 0 - 44 U/L 12   Edinburgh Score: Edinburgh Postnatal Depression Scale Screening Tool 07/19/2020  I have been able to laugh and see the funny side of things. 2  I have looked forward with enjoyment to things. 1  I have blamed myself unnecessarily when things went wrong. 2  I have been anxious or worried for no good reason. 3  I have felt scared or panicky for no good reason. 2  Things have been getting on top of me. 2  I have been so unhappy that I have had difficulty sleeping. 2  I have felt sad or miserable. 2  I have been so unhappy that I have been crying. 3  The thought of harming myself has occurred to me. 0  Edinburgh Postnatal Depression Scale Total 19   Updated Edinburgh Score: 6 11/15/2020   After visit meds:  Allergies as of  11/15/2020      Reactions   Penicillins Nausea And Vomiting      Medication List    TAKE these medications   ferrous sulfate 325 (65 FE) MG tablet Take 325 mg by mouth once. Every other day   Prenatal Vitamin 27-0.8 MG Tabs Take 1 tablet by mouth daily. OLLY PRENATAL VITAMIN - OTC   sertraline 50 MG tablet Commonly known as: Zoloft Take 1 tablet (50 mg total) by mouth daily.        Discharge home in stable condition Infant Feeding: Breast Infant Disposition:home with mother Discharge instruction: per After Visit Summary and Postpartum booklet. Activity: Advance as tolerated. Pelvic rest for 6 weeks.  Diet: routine diet Anticipated Birth Control: IUD Paragard Postpartum Appointment:2 weeks Additional Postpartum F/U: Postpartum Depression checkup   Future Appointments: Future Appointments  Date Time Provider [place] of [place]  12/24/2020  2:30 PM Will Bonnet, MD [facility] None   Follow up Visit:  Follow-up Information    Will Bonnet, MD. Schedule an appointment as soon as possible for a visit in 2 week(s).   Specialty: Obstetrics and Gynecology Why: postpartum blood pressure and mood check Contact information: [address] [place] [address] [phone]                11/15/2020 Rod Can, CNM

## 2020-11-14 NOTE — Lactation Note (Signed)
This note was copied from a baby's chart. Lactation Consultation Note  Patient Name: Monique Barnett [id number] Date: 11/14/2020 Reason for consult: Follow-up assessment;Mother's request;1st time breastfeeding;Early term 37-38.6wks;Other (Comment) (sleepy) Age:26 hours  Lactation called in by parents. Baby skin to skin with dad showing early hunger cues. LC assisted with placement of support pillows for football hold and brought baby to mom. After several minutes, attempts with latching, and hand expression baby finally accepted and sustained latch for 10 minutes with rhythmic sucking pattern, a few noted swallows. Baby placed skin to skin, and then offered opposite breast in cradle hold where he grasped the breast easier and with more enthusiasm and immediately began a strong rhythmic sucking pattern with a few audible swallows.  LC provided continuous stimulation, and education/demonstrated to parents how to keep baby alert and awake at the breast throughout feeding. Baby still remained on R breast in cradle hold after 10 minutes. LC left bedside with dad supporting mom and baby. Encouraged continued feeding efforts, feeding with cues, hand expression/spoon feeding if needed.   Maternal Data Has patient been taught Hand Expression?: Yes Does the patient have breastfeeding experience prior to this delivery?: No  Feeding Mother's Current Feeding Choice: Breast Milk  LATCH Score Latch: Grasps breast easily, tongue down, lips flanged, rhythmical sucking.  Audible Swallowing: A few with stimulation  Type of Nipple: Everted at rest and after stimulation  Comfort (Breast/Nipple): Soft / non-tender  Hold (Positioning): Assistance needed to correctly position infant at breast and maintain latch.  LATCH Score: 8   Lactation Tools Discussed/Used    Interventions Interventions: Breast feeding basics reviewed;Assisted with latch;Skin to skin;Hand express;Breast compression;Adjust  position;Support pillows;Position options;Education  Discharge    Consult Status Consult Status: Follow-up Date: 11/15/20 Follow-up type: In-patient    Danford Bad 11/14/2020, 4:19 PM

## 2020-11-14 NOTE — Progress Notes (Signed)
I don't know why this came to me, I think Jae Dire saw her recently in triage though. You just did her delivery so this is just an FYI in case it needs to be collected again. Thanks

## 2020-11-14 NOTE — Lactation Note (Signed)
This note was copied from a baby's chart. Lactation Consultation Note  Patient Name: Monique Barnett [id number] Date: 11/14/2020 Reason for consult: L&D Initial assessment;1st time breastfeeding;Early term 37-38.6wks Age:26 hours  Lactation at bedside in LDR1 to assist with first attempt with breastfeeding. Mom delivered SVD almost 2hrs ago to first baby and desires exclusive breastfeeding. Baby skin to skin, in football hold by L&D RN, suckling and licking at the breast. Multiple attempts made to latch and sustain latch with rhythmic sucking. Baby latches easily but sits at the breast with no effort in sucking.  LC taught hand expression to mom and attempted to re-latch, again latch successful but nipple was held in mouth.  After several attempts, LC hand expressed approximately 1-38mL into spoon and fed to infant; infant tolerated well, resting comfortably on mom's chest. LC reviewed early hunger cues, feeding on demand, skin to skin benefits, and output expectations for first 24 hours. Encouraged 8-12 feeding attempts, hand expression/spoon feeding if baby is sleepy/not eager at the breast. LC to follow-up once family is moved to [facility].  Maternal Data Has patient been taught Hand Expression?: Yes Does the patient have breastfeeding experience prior to this delivery?: No  Feeding Mother's Current Feeding Choice: Breast Milk  LATCH Score Latch: Repeated attempts needed to sustain latch, nipple held in mouth throughout feeding, stimulation needed to elicit sucking reflex.  Audible Swallowing: None  Type of Nipple: Everted at rest and after stimulation  Comfort (Breast/Nipple): Soft / non-tender  Hold (Positioning): Assistance needed to correctly position infant at breast and maintain latch.  LATCH Score: 6   Lactation Tools Discussed/Used    Interventions Interventions: Breast feeding basics reviewed;Assisted with latch;Skin to skin;Hand express;Adjust position;Support  pillows;Position options;Education  Discharge    Consult Status Consult Status: Follow-up Date: 11/14/20 Follow-up type: In-patient    Danford Bad 11/14/2020, 9:38 AM

## 2020-11-14 NOTE — Telephone Encounter (Signed)
Patient is scheduled for 12/24/20 at 2:30 with SDJ for 6 week PP and paraguard placement

## 2020-11-15 LAB — CBC
HCT: 25.5 % — ABNORMAL LOW (ref 36.0–46.0)
Hemoglobin: 8.5 g/dL — ABNORMAL LOW (ref 12.0–15.0)
MCH: 31.1 pg (ref 26.0–34.0)
MCHC: 33.3 g/dL (ref 30.0–36.0)
MCV: 93.4 fL (ref 80.0–100.0)
Platelets: 168 10*3/uL (ref 150–400)
RBC: 2.73 MIL/uL — ABNORMAL LOW (ref 3.87–5.11)
RDW: 15.6 % — ABNORMAL HIGH (ref 11.5–15.5)
WBC: 11.8 10*3/uL — ABNORMAL HIGH (ref 4.0–10.5)
nRBC: 0 % (ref 0.0–0.2)

## 2020-11-15 NOTE — Progress Notes (Signed)
Pt discharged with infant. Discharge instructions, prescriptions, and follow up appointments given to and reviewed with patient. Pt verbalized understanding. Escorted out by auxillary.  

## 2020-11-15 NOTE — Anesthesia Postprocedure Evaluation (Signed)
Anesthesia Post Note  Patient: Monique Barnett  Procedure(s) Performed: AN AD HOC LABOR EPIDURAL  Patient location during evaluation: Mother Baby Anesthesia Type: Epidural Level of consciousness: awake Pain management: pain level controlled Respiratory status: spontaneous breathing Cardiovascular status: stable Anesthetic complications: no   No complications documented.   Last Vitals:  Vitals:   11/14/20 1925 11/14/20 2332  BP: 128/88 133/75  Pulse: 73 70  Resp: 18 20  Temp: 36.6 C 36.8 C  SpO2: 92% 100%    Last Pain:  Vitals:   11/14/20 2332  TempSrc: Oral  PainSc:                  Jaye Beagle

## 2020-11-18 NOTE — Telephone Encounter (Signed)
Noted. Will order to arrive by apt date/time. 

## 2020-11-25 ENCOUNTER — Ambulatory Visit (INDEPENDENT_AMBULATORY_CARE_PROVIDER_SITE_OTHER): Payer: 59 | Admitting: Obstetrics and Gynecology

## 2020-11-25 ENCOUNTER — Encounter: Payer: Self-pay | Admitting: Obstetrics and Gynecology

## 2020-11-25 ENCOUNTER — Other Ambulatory Visit: Payer: Self-pay

## 2020-11-25 DIAGNOSIS — O133 Gestational [pregnancy-induced] hypertension without significant proteinuria, third trimester: Secondary | ICD-10-CM

## 2020-11-25 DIAGNOSIS — F4321 Adjustment disorder with depressed mood: Secondary | ICD-10-CM

## 2020-11-25 NOTE — Progress Notes (Signed)
Obstetrics & Gynecology Office Visit   Chief Complaint  Patient presents with  . Blood Pressure Check  . Postpartum Care    History of Present Illness: 25 y.o. G67P1001 female who is postpartum day 11 from an SVD.  She had gestational hypertension. She has a history of situational depression and had an EPDS score of 19 at discharge. She has been on sertraline 50 mg.   She denies any issues. She scored an EPDS of 2 today. Denies SI/HI.  She has no issues with blood pressure. She denies HA, visual changes, and RUQ pain.  She has normal lochia.  She wants a copper IUD for contraception.    Past Medical History:  Diagnosis Date  . Kidney stone     Past Surgical History:  Procedure Laterality Date  . MOUTH SURGERY      Gynecologic History: No LMP recorded.  Obstetric History: G1P1001  Family History  Problem Relation Age of Onset  . Anxiety disorder Maternal Grandmother   . Cancer Maternal Grandfather   . Diabetes Paternal Grandfather     Social History   Socioeconomic History  . Marital status: Significant Other    Spouse name: Josue  . Number of children: Not on file  . Years of education: Not on file  . Highest education level: Not on file  Occupational History  . Not on file  Tobacco Use  . Smoking status: Never Smoker  . Smokeless tobacco: Never Used  Substance and Sexual Activity  . Alcohol use: No  . Drug use: No  . Sexual activity: Yes    Partners: Male    Birth control/protection: I.U.D.  Other Topics Concern  . Not on file  Social History Narrative  . Not on file   Social Determinants of Health   Financial Resource Strain: Not on file  Food Insecurity: Not on file  Transportation Needs: Not on file  Physical Activity: Not on file  Stress: Not on file  Social Connections: Not on file  Intimate Partner Violence: Not on file    Allergies  Allergen Reactions  . Penicillins Nausea And Vomiting    Prior to Admission medications   Medication  Sig Start Date End Date Taking? Authorizing Provider  ferrous sulfate 325 (65 FE) MG tablet Take 325 mg by mouth every other day.   Yes [provider]  Prenatal Vit-Fe Fumarate-FA (PRENATAL VITAMIN) 27-0.8 MG TABS Take 1 tablet by mouth daily. OLLY PRENATAL VITAMIN - OTC   Yes [provider]  sertraline (ZOLOFT) 50 MG tablet Take 1 tablet (50 mg total) by mouth daily. Patient taking differently: Take 25 mg by mouth daily. 07/19/20  Yes Mirna Mires, CNM    Review of Systems  Constitutional: Negative.   HENT: Negative.   Eyes: Negative.   Respiratory: Negative.   Cardiovascular: Negative.   Gastrointestinal: Negative.   Genitourinary: Negative.   Musculoskeletal: Negative.   Skin: Negative.   Neurological: Negative.   Psychiatric/Behavioral: Negative.      Physical Exam BP 128/84  No LMP recorded. Physical Exam Constitutional:      General: She is not in acute distress.    Appearance: Normal appearance.  HENT:     Head: Normocephalic and atraumatic.  Eyes:     General: No scleral icterus.    Conjunctiva/sclera: Conjunctivae normal.  Neurological:     General: No focal deficit present.     Mental Status: She is alert and oriented to person, place, and time.  Cranial Nerves: No cranial nerve deficit.  Psychiatric:        Mood and Affect: Mood normal.        Behavior: Behavior normal.        Judgment: Judgment normal.     Female chaperone present for pelvic and breast  portions of the physical exam  Assessment: 25 y.o. G8P1001 female here for  1. Postpartum care following vaginal delivery   2. Situational depression   3. Gestational hypertension, third trimester      Plan: Problem List Items Addressed This Visit      Cardiovascular and Mediastinum   Gestational hypertension     Other   Situational depression   Postpartum care following vaginal delivery - Primary    Continue current care.  Paragard IUD placed at 6 week  appt   Thomasene Mohair, MD 11/25/2020 11:33 AM

## 2020-12-22 ENCOUNTER — Other Ambulatory Visit: Payer: Self-pay | Admitting: Obstetrics

## 2020-12-22 DIAGNOSIS — Z3480 Encounter for supervision of other normal pregnancy, unspecified trimester: Secondary | ICD-10-CM

## 2020-12-22 DIAGNOSIS — Z3A22 22 weeks gestation of pregnancy: Secondary | ICD-10-CM

## 2020-12-22 DIAGNOSIS — F4321 Adjustment disorder with depressed mood: Secondary | ICD-10-CM

## 2020-12-23 ENCOUNTER — Other Ambulatory Visit: Payer: Self-pay | Admitting: Obstetrics

## 2020-12-24 ENCOUNTER — Other Ambulatory Visit: Payer: Self-pay

## 2020-12-24 ENCOUNTER — Ambulatory Visit (INDEPENDENT_AMBULATORY_CARE_PROVIDER_SITE_OTHER): Payer: 59 | Admitting: Obstetrics and Gynecology

## 2020-12-24 DIAGNOSIS — Z538 Procedure and treatment not carried out for other reasons: Secondary | ICD-10-CM

## 2020-12-24 DIAGNOSIS — Z30430 Encounter for insertion of intrauterine contraceptive device: Secondary | ICD-10-CM | POA: Diagnosis not present

## 2020-12-24 NOTE — Progress Notes (Signed)
Postpartum Visit  Chief Complaint:  Chief Complaint  Patient presents with  . Postpartum Care    History of Present Illness: Patient is a 25 y.o. G1P1001 presents for postpartum visit.  Date of delivery: 11/14/2020 Type of delivery: Vaginal delivery - Vacuum or forceps assisted   no Episiotomy No.  Laceration: 2nd degree Pregnancy or labor problems:  no Any problems since the delivery:  no  Newborn Details:  SINGLETON :  1. Baby's name: Josue. Birth weight: 6.15lb Maternal Details:  Breast Feeding:  Breast and bottle Post partum depression/anxiety noted:  no Edinburgh Post-Partum Depression Score:  3  Date of last PAP: 04/08/2020-normal   Past Medical History:  Diagnosis Date  . Kidney stone     Past Surgical History:  Procedure Laterality Date  . MOUTH SURGERY      Prior to Admission medications   Medication Sig Start Date End Date Taking? Authorizing Provider  sertraline (ZOLOFT) 50 MG tablet Take 0.5 tablets (25 mg total) by mouth daily. 12/23/20  Yes Mirna Mires, CNM  ferrous sulfate 325 (65 FE) MG tablet Take 325 mg by mouth every other day. Patient not taking: Reported on 12/24/2020    [provider]  Prenatal Vit-Fe Fumarate-FA (PRENATAL VITAMIN) 27-0.8 MG TABS Take 1 tablet by mouth daily. OLLY PRENATAL VITAMIN - OTC Patient not taking: Reported on 12/24/2020    [provider]    Allergies  Allergen Reactions  . Penicillins Nausea And Vomiting     Social History   Socioeconomic History  . Marital status: Significant Other    Spouse name: Josue  . Number of children: Not on file  . Years of education: Not on file  . Highest education level: Not on file  Occupational History  . Not on file  Tobacco Use  . Smoking status: Never Smoker  . Smokeless tobacco: Never Used  Substance and Sexual Activity  . Alcohol use: No  . Drug use: No  . Sexual activity: Yes    Partners: Male    Birth control/protection: I.U.D.  Other Topics  Concern  . Not on file  Social History Narrative  . Not on file   Social Determinants of Health   Financial Resource Strain: Not on file  Food Insecurity: Not on file  Transportation Needs: Not on file  Physical Activity: Not on file  Stress: Not on file  Social Connections: Not on file  Intimate Partner Violence: Not on file    Family History  Problem Relation Age of Onset  . Anxiety disorder Maternal Grandmother   . Cancer Maternal Grandfather   . Diabetes Paternal Grandfather     Review of Systems  Constitutional: Negative.   HENT: Negative.   Eyes: Negative.   Respiratory: Negative.   Cardiovascular: Negative.   Gastrointestinal: Negative.   Genitourinary: Negative.   Musculoskeletal: Negative.   Skin: Negative.   Neurological: Negative.   Psychiatric/Behavioral: Negative.      Physical Exam BP 122/74   Ht 5\' 4"  (1.626 m)   Wt 205 lb (93 kg)   BMI 35.19 kg/m   Physical Exam Constitutional:      General: She is not in acute distress.    Appearance: Normal appearance. She is well-developed.  Genitourinary:     Vulva, bladder and urethral meatus normal.     Right Labia: No rash, tenderness, lesions, skin changes or Bartholin's cyst.    Left Labia: No tenderness, skin changes, Bartholin's cyst or rash.    No inguinal  adenopathy present in the right or left side.    Pelvic Tanner Score: 5/5.     Right Adnexa: not tender, not full and no mass present.    Left Adnexa: not tender, not full and no mass present.    No cervical motion tenderness, friability, lesion or polyp.     Uterus is not enlarged, fixed or tender.     Uterus is anteverted.     No urethral tenderness or mass present.     Pelvic exam was performed with patient in the lithotomy position.  HENT:     Head: Normocephalic and atraumatic.  Eyes:     General: No scleral icterus.    Conjunctiva/sclera: Conjunctivae normal.  Cardiovascular:     Rate and Rhythm: Normal rate and regular rhythm.      Heart sounds: No murmur heard. No friction rub. No gallop.   Pulmonary:     Effort: Pulmonary effort is normal. No respiratory distress.     Breath sounds: Normal breath sounds. No wheezing or rales.  Abdominal:     General: Bowel sounds are normal. There is no distension.     Palpations: Abdomen is soft. There is no mass.     Tenderness: There is no abdominal tenderness. There is no guarding or rebound.     Hernia: There is no hernia in the left inguinal area or right inguinal area.  Musculoskeletal:        General: Normal range of motion.     Cervical back: Normal range of motion and neck supple.  Lymphadenopathy:     Lower Body: No right inguinal adenopathy. No left inguinal adenopathy.  Neurological:     General: No focal deficit present.     Mental Status: She is alert and oriented to person, place, and time.     Cranial Nerves: No cranial nerve deficit.  Skin:    General: Skin is warm and dry.     Findings: No erythema.  Psychiatric:        Mood and Affect: Mood normal.        Behavior: Behavior normal.        Judgment: Judgment normal.    IUD Insertion Procedure Note (Paragard) Patient identified, informed consent performed, consent signed.   Discussed risks of irregular bleeding, cramping, infection, malpositioning, expulsion or uterine perforation of the IUD (1:1000 placements)  which may require further procedure such as laparoscopy.  IUD while effective at preventing pregnancy do not prevent transmission of sexually transmitted diseases and use of barrier methods for this purpose was discussed. Time out was performed.  Urine pregnancy test negative.  Speculum placed in the vagina.  Cervix visualized.  Cleaned with Betadine x 2.  Grasped anteriorly with a single tooth tenaculum.  Uterus sounded to 6.5 cm. IUD placed per manufacturer's recommendations.  Strings trimmed to 3 cm. Tenaculum was removed, good hemostasis noted.  Patient tolerated procedure well.   An ultrasound  was performed due to wanting to be sure of correct positioning of the IUD.  The ultrasound revealed the IUD to be lower in the uterus than recommended. I removed the IUD.    Female Chaperone present during breast and/or pelvic exam.  Assessment: 25 y.o. G1P1001 presenting for 6 week postpartum visit  Plan: Problem List Items Addressed This Visit   None   Visit Diagnoses    Postpartum care and examination    -  Primary   Unsuccessful IUD insertion  1) Contraception: Failed IUD placement. After discussion, she elected to return to clinic to have it placed with ultrasound guidance by me.  Her uterus was severely anteflexed and this likely accounted for positioning of the IUD. Routine contraceptive measures emphasized. Marland Kitchen  2)  Pap - ASCCP guidelines and rational discussed.  Patient opts for routine screening interval  3) Patient underwent screening for postpartum depression with no concerns noted.  Return in about 4 weeks (around 01/21/2021) for Paragard IUD placement with Dr. Jean Rosenthal.   Thomasene Mohair, MD 12/24/2020 2:41 PM

## 2020-12-26 ENCOUNTER — Encounter: Payer: Self-pay | Admitting: Obstetrics and Gynecology

## 2021-01-03 ENCOUNTER — Other Ambulatory Visit: Payer: Self-pay

## 2021-01-03 ENCOUNTER — Ambulatory Visit (INDEPENDENT_AMBULATORY_CARE_PROVIDER_SITE_OTHER): Payer: 59 | Admitting: Obstetrics and Gynecology

## 2021-01-03 ENCOUNTER — Encounter: Payer: Self-pay | Admitting: Obstetrics and Gynecology

## 2021-01-03 VITALS — BP 118/78 | Wt 203.0 lb

## 2021-01-03 DIAGNOSIS — N9089 Other specified noninflammatory disorders of vulva and perineum: Secondary | ICD-10-CM

## 2021-01-03 DIAGNOSIS — Z30011 Encounter for initial prescription of contraceptive pills: Secondary | ICD-10-CM | POA: Diagnosis not present

## 2021-01-03 MED ORDER — NORETHIN ACE-ETH ESTRAD-FE 1-20 MG-MCG PO TABS: 1.0000 | ORAL_TABLET | Freq: Every day | ORAL | 4 refills | Status: DC

## 2021-01-03 NOTE — Progress Notes (Signed)
Obstetrics & Gynecology Office Visit    Chief Complaint  Patient presents with  . Follow-up    Vaginal sores    History of Present Illness: 25 y.o. G53P1001 female who presents with a 4-week history of vulvar sores.  She notes a small black area on her skin that looks like a hair that is pushing through.  If area becomes larger and will either rupture with expulsion of pus and blood.  After this a ulceration remains.  This is happened in several areas in her vulvar area.  No areas in the vaginal region or labia minora.  The area is not painful.  She has never had this before and has no history of HSV.    Past Medical History:  Diagnosis Date  . Kidney stone     Past Surgical History:  Procedure Laterality Date  . MOUTH SURGERY      Gynecologic History: No LMP recorded.  Obstetric History: G1P1001  Family History  Problem Relation Age of Onset  . Anxiety disorder Maternal Grandmother   . Cancer Maternal Grandfather   . Diabetes Paternal Grandfather     Social History   Socioeconomic History  . Marital status: Significant Other    Spouse name: Josue  . Number of children: Not on file  . Years of education: Not on file  . Highest education level: Not on file  Occupational History  . Not on file  Tobacco Use  . Smoking status: Never Smoker  . Smokeless tobacco: Never Used  Substance and Sexual Activity  . Alcohol use: No  . Drug use: No  . Sexual activity: Yes    Partners: Male    Birth control/protection: I.U.D.  Other Topics Concern  . Not on file  Social History Narrative  . Not on file   Social Determinants of Health   Financial Resource Strain: Not on file  Food Insecurity: Not on file  Transportation Needs: Not on file  Physical Activity: Not on file  Stress: Not on file  Social Connections: Not on file  Intimate Partner Violence: Not on file    Allergies  Allergen Reactions  . Penicillins Nausea And Vomiting    Prior to Admission  medications   Medication Sig Start Date End Date Taking? Authorizing Provider  ferrous sulfate 325 (65 FE) MG tablet Take 325 mg by mouth every other day. Patient not taking: Reported on 12/24/2020    [provider]  Prenatal Vit-Fe Fumarate-FA (PRENATAL VITAMIN) 27-0.8 MG TABS Take 1 tablet by mouth daily. OLLY PRENATAL VITAMIN - OTC Patient not taking: Reported on 12/24/2020    [provider]  sertraline (ZOLOFT) 50 MG tablet Take 0.5 tablets (25 mg total) by mouth daily. 12/23/20   Mirna Mires, CNM    Review of Systems  Constitutional: Negative.   HENT: Negative.   Eyes: Negative.   Respiratory: Negative.   Cardiovascular: Negative.   Gastrointestinal: Negative.   Genitourinary: Negative.        See HPI  Musculoskeletal: Negative.   Skin: Negative.   Neurological: Negative.   Psychiatric/Behavioral: Negative.      Physical Exam BP 118/78   Wt 203 lb (92.1 kg)   BMI 34.84 kg/m  No LMP recorded. Physical Exam Constitutional:      General: She is not in acute distress.    Appearance: Normal appearance.  Genitourinary:     Bladder and urethral meatus normal.     No lesions in the vagina.  Right Labia: No rash, tenderness, lesions or skin changes.    Left Labia: No tenderness, lesions, skin changes or rash.       No inguinal adenopathy present in the right or left side.    Pelvic Tanner Score: 5/5.    No vaginal discharge, erythema, bleeding or ulceration.     No vaginal prolapse present.     Right Adnexa: not tender, not full and no mass present.    Left Adnexa: not tender, not full and no mass present.    No cervical motion tenderness, discharge, friability, lesion or polyp.     Uterus is not enlarged, fixed or tender.     Uterus is anteverted.  HENT:     Head: Normocephalic and atraumatic.  Eyes:     General: No scleral icterus.    Conjunctiva/sclera: Conjunctivae normal.  Lymphadenopathy:     Lower Body: No right inguinal  adenopathy. No left inguinal adenopathy.  Neurological:     General: No focal deficit present.     Mental Status: She is alert and oriented to person, place, and time.     Cranial Nerves: No cranial nerve deficit.  Psychiatric:        Mood and Affect: Mood normal.        Behavior: Behavior normal.        Judgment: Judgment normal.     Female chaperone present for pelvic and breast  portions of the physical exam  Assessment: 25 y.o. G64P1001 female here for  1. Vulvar lesion      Plan: Problem List Items Addressed This Visit   None   Visit Diagnoses    Vulvar lesion    -  Primary   Relevant Orders   RPR    Discussed different etiologies of this type of lesion.  The lesions are not behaving in any particular way.  They simply could be an ingrown hair.  However, she has not shaved in a while.  HSV, syphilis, hidradenitis are under consideration.  However, none fit well with the clinical description.  We will obtain an RPR today.  I was unable to take an HSV swab due to lack of availability of swabs for testing.  If her lesions continue, I recommend that she see a dermatologist given that these lesions are not typical.  They could be related to the hair follicle.  However, with this clinical presentation today that is not clear.  A total of 23 minutes were spent face-to-face with the patient as well as preparation, review, communication, and documentation during this encounter.    Thomasene Mohair, MD 01/03/2021 1:58 PM

## 2021-01-04 LAB — RPR: RPR Ser Ql: NONREACTIVE

## 2021-06-20 NOTE — Telephone Encounter (Signed)
Failed insertion/Paragard placed. U/S showed in lower uterine segment. Paragard removed. Patient opted for rx at 01/03/21 return visit.

## 2021-06-25 ENCOUNTER — Other Ambulatory Visit: Payer: Self-pay

## 2021-06-25 ENCOUNTER — Encounter: Payer: Self-pay | Admitting: Obstetrics and Gynecology

## 2021-06-25 ENCOUNTER — Ambulatory Visit (INDEPENDENT_AMBULATORY_CARE_PROVIDER_SITE_OTHER): Payer: Self-pay | Admitting: Obstetrics and Gynecology

## 2021-06-25 VITALS — BP 120/80 | Ht 64.0 in | Wt 202.0 lb

## 2021-06-25 DIAGNOSIS — F410 Panic disorder [episodic paroxysmal anxiety] without agoraphobia: Secondary | ICD-10-CM

## 2021-06-25 MED ORDER — LORAZEPAM 0.5 MG PO TABS: 0.5000 mg | ORAL_TABLET | Freq: Three times a day (TID) | ORAL | 0 refills | Status: DC

## 2021-06-25 NOTE — Progress Notes (Signed)
Obstetrics & Gynecology Office Visit   Chief Complaint  Patient presents with   Amenorrhea    History of Present Illness: 25 y.o. G43P1001 female who presents for a late period and feeling bad last night.  She has been having regular periods for the last 5 months.  She was due for a period about a week ago.  Last night she woke up and felt like she was feeling hot (like on fire hot), faint, sweaty, light-headed, nauseated.  She called EMS thinking that she was going to pass out. She had no one at home with her besides her small child. She had normal vitals, normal blood glucose, normal heart rate. This all lasted nearly an hour.  She was told that she had a normal temperature.  Outside of pregnancy she has never had this happen before.  She took a pregnancy test on 10/20 and this was negative.  Her periods had been coming regularly.   She felt a little sick before bed, but nothing to the point where she should go to the hospital.  She had been asleep for about 1-1.5 hours prior to all of this.  She has had panic attacks before and they do not normally last nearly this long.  She was told her blood sugar was normal.  She had a glucose tolerance test in January (1 hour gtt) that was normal.  She is on no contraception at the moment.   Past Medical History:  Diagnosis Date   Kidney stone    Past Surgical History:  Procedure Laterality Date   MOUTH SURGERY     Gynecologic History: Patient's last menstrual period was 05/15/2021.  Obstetric History: G1P1001  Family History  Problem Relation Age of Onset   Anxiety disorder Maternal Grandmother    Cancer Maternal Grandfather    Diabetes Paternal Grandfather     Social History   Socioeconomic History   Marital status: Significant Other    Spouse name: Josue   Number of children: Not on file   Years of education: Not on file   Highest education level: Not on file  Occupational History   Not on file  Tobacco Use   Smoking status:  Never   Smokeless tobacco: Never  Substance and Sexual Activity   Alcohol use: No   Drug use: No   Sexual activity: Yes    Partners: Male    Birth control/protection: I.U.D.  Other Topics Concern   Not on file  Social History Narrative   Not on file   Social Determinants of Health   Financial Resource Strain: Not on file  Food Insecurity: Not on file  Transportation Needs: Not on file  Physical Activity: Not on file  Stress: Not on file  Social Connections: Not on file  Intimate Partner Violence: Not on file    Allergies  Allergen Reactions   Penicillins Nausea And Vomiting    Prior to Admission medications: Denies   Review of Systems  Constitutional: Negative.   HENT: Negative.    Eyes: Negative.   Respiratory: Negative.    Cardiovascular: Negative.   Gastrointestinal: Negative.   Genitourinary: Negative.   Musculoskeletal: Negative.   Skin: Negative.   Neurological: Negative.   Psychiatric/Behavioral: Negative.      Physical Exam BP 120/80   Ht 5\' 4"  (1.626 m)   Wt 202 lb (91.6 kg)   LMP 05/15/2021   BMI 34.67 kg/m  Patient's last menstrual period was 05/15/2021. Physical Exam Constitutional:  General: She is not in acute distress.    Appearance: Normal appearance. She is well-developed.  HENT:     Head: Normocephalic and atraumatic.  Eyes:     General: No scleral icterus.    Conjunctiva/sclera: Conjunctivae normal.  Cardiovascular:     Rate and Rhythm: Normal rate and regular rhythm.     Heart sounds: No murmur heard.   No friction rub. No gallop.  Pulmonary:     Effort: Pulmonary effort is normal. No respiratory distress.     Breath sounds: Normal breath sounds. No wheezing or rales.  Abdominal:     General: Bowel sounds are normal. There is no distension.     Palpations: Abdomen is soft. There is no mass.     Tenderness: There is no abdominal tenderness. There is no guarding or rebound.  Musculoskeletal:        General: Normal range of  motion.     Cervical back: Normal range of motion and neck supple.  Neurological:     General: No focal deficit present.     Mental Status: She is alert and oriented to person, place, and time.     Cranial Nerves: No cranial nerve deficit.  Skin:    General: Skin is warm and dry.     Findings: No erythema.  Psychiatric:        Mood and Affect: Mood normal.        Behavior: Behavior normal.        Judgment: Judgment normal.   Female chaperone present for pelvic and breast  portions of the physical exam  Assessment: 25 y.o. G40P1001 female here for  1. Panic attack      Plan: Problem List Items Addressed This Visit   None Visit Diagnoses     Panic attack    -  Primary   Relevant Medications   LORazepam (ATIVAN) 0.5 MG tablet   Other Relevant Orders   TSH   CBC   Comprehensive metabolic panel      Etiology of her symptoms unclear. She was cleared by EMT to stay at home. I will check some basic labs today.  If normal, another potential etiology would be cardiac.  She has no cardiac history and has been pregnant recently (delivered 7 months ago) and had no cardiac issues.  If her symptoms recur, I have given her a low dose of ativan to try to see if it breaks what might be a panic attack.  We also discussed that she should see a family medicine or internal medicine provider should her symptoms recur due to my lack of having an EKG machine and that this is likely to be a problem unrelated to the reproductive system.  Her exam is normal today and she has no symptoms.  She voiced understanding.  While the diagnosis listed on this note is panic attack (there is no other etiology that I can find), we discussed that if she should have these symptoms again and Ativan does not help, she should call 911 or get to the ER ASAP.    A total of 24 minutes were spent face-to-face with the patient as well as preparation, review, communication, and documentation during this encounter.    Thomasene Mohair, MD 06/25/2021 3:01 PM

## 2021-06-26 LAB — COMPREHENSIVE METABOLIC PANEL
ALT: 14 IU/L (ref 0–32)
AST: 14 IU/L (ref 0–40)
Albumin/Globulin Ratio: 1.9 (ref 1.2–2.2)
Albumin: 4.7 g/dL (ref 3.9–5.0)
Alkaline Phosphatase: 113 IU/L (ref 44–121)
BUN/Creatinine Ratio: 15 (ref 9–23)
BUN: 12 mg/dL (ref 6–20)
Bilirubin Total: 0.3 mg/dL (ref 0.0–1.2)
CO2: 23 mmol/L (ref 20–29)
Calcium: 9.7 mg/dL (ref 8.7–10.2)
Chloride: 101 mmol/L (ref 96–106)
Creatinine, Ser: 0.81 mg/dL (ref 0.57–1.00)
Globulin, Total: 2.5 g/dL (ref 1.5–4.5)
Glucose: 83 mg/dL (ref 70–99)
Potassium: 4.6 mmol/L (ref 3.5–5.2)
Sodium: 139 mmol/L (ref 134–144)
Total Protein: 7.2 g/dL (ref 6.0–8.5)
eGFR: 103 mL/min/{1.73_m2} (ref >59)

## 2021-06-26 LAB — CBC
Hematocrit: 35.7 % (ref 34.0–46.6)
Hemoglobin: 12 g/dL (ref 11.1–15.9)
MCH: 29.6 pg (ref 26.6–33.0)
MCHC: 33.6 g/dL (ref 31.5–35.7)
MCV: 88 fL (ref 79–97)
Platelets: 262 10*3/uL (ref 150–450)
RBC: 4.06 x10E6/uL (ref 3.77–5.28)
RDW: 13.1 % (ref 11.7–15.4)
WBC: 6.9 10*3/uL (ref 3.4–10.8)

## 2021-06-26 LAB — TSH: TSH: 2.01 u[IU]/mL (ref 0.450–4.500)

## 2021-12-25 ENCOUNTER — Telehealth: Payer: Self-pay | Admitting: Family Medicine

## 2021-12-25 NOTE — Telephone Encounter (Signed)
Left message for pt to call office to make appt for annual in May 2023 ?

## 2022-07-07 IMAGING — US US MFM OB DETAIL+14 WK
2 series · 12 of 28 positions shown · non-contrast
Comparison: none

[Series 1: us mfm ob detail+14 wk · 9 of 69 slices shown (1 of 2)]
[im 4/69]
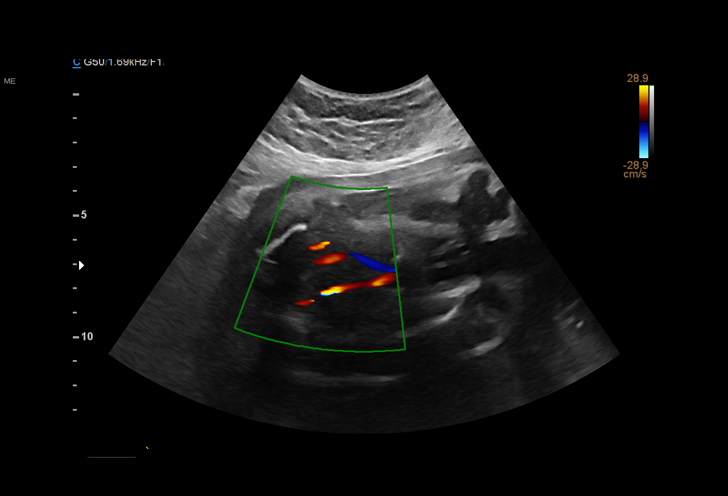
[im 11/69]
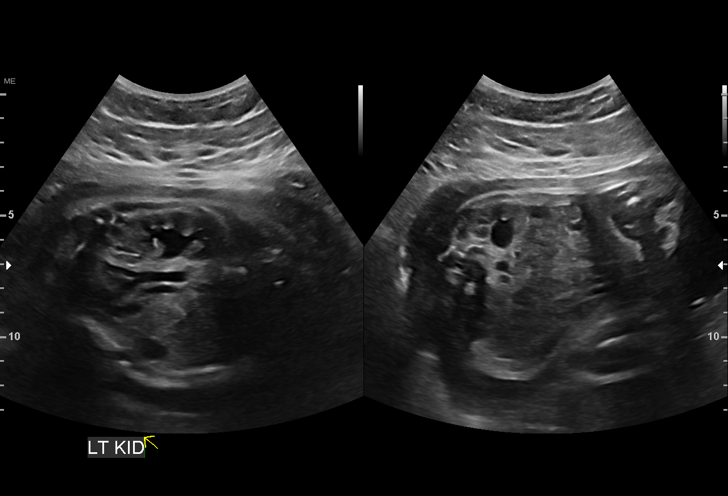
[im 18/69]
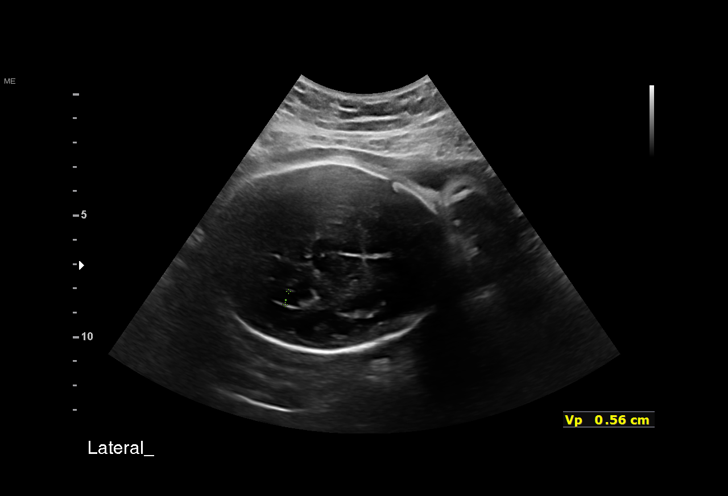
[im 28/69]
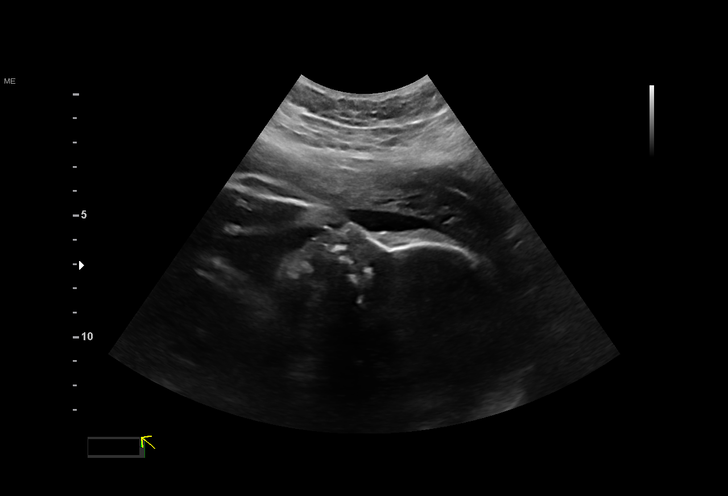
[im 35/69]
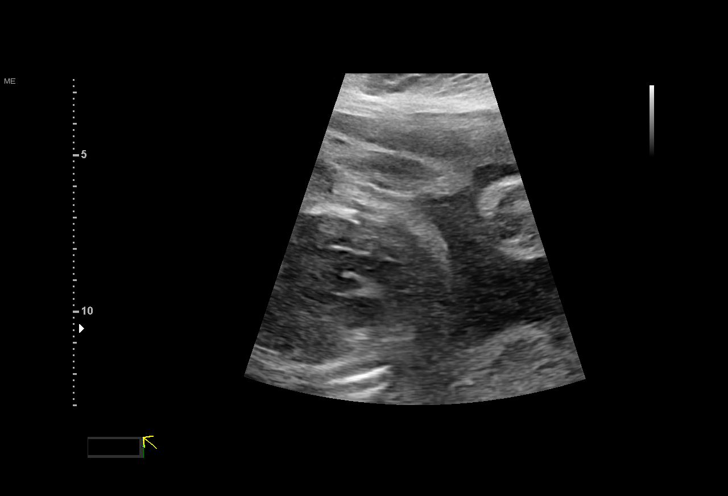
[im 41/69]
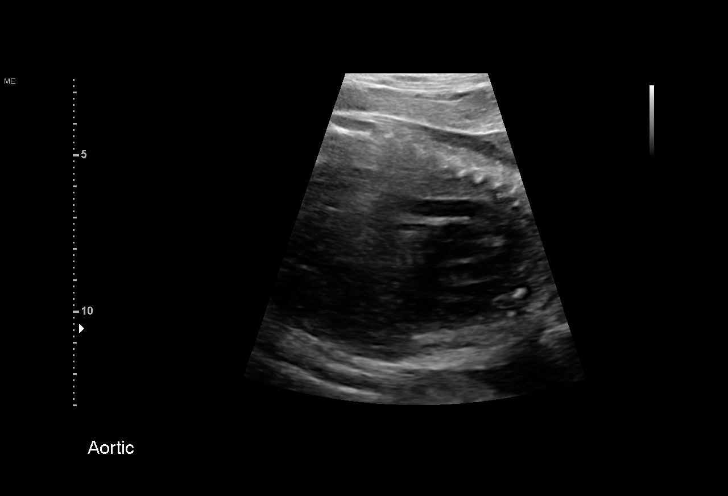
[im 52/69]
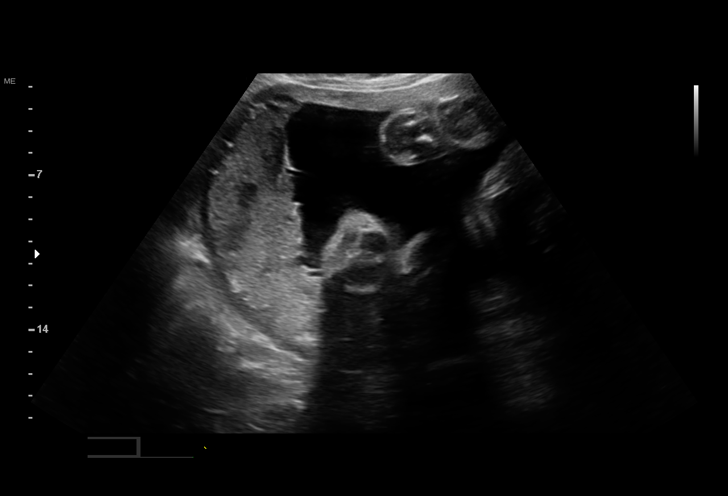
[im 58/69]
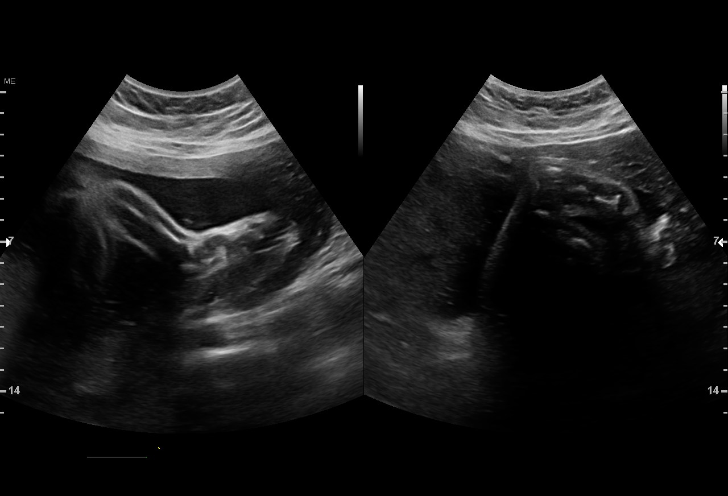
[im 65/69]
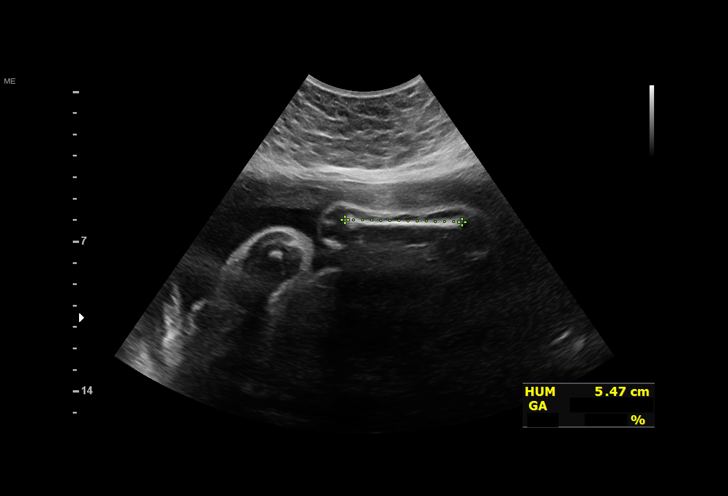

[Series 3: us mfm ob detail+14 wk · 3 of 25 slices shown (2 of 2)]
[im 5/25]
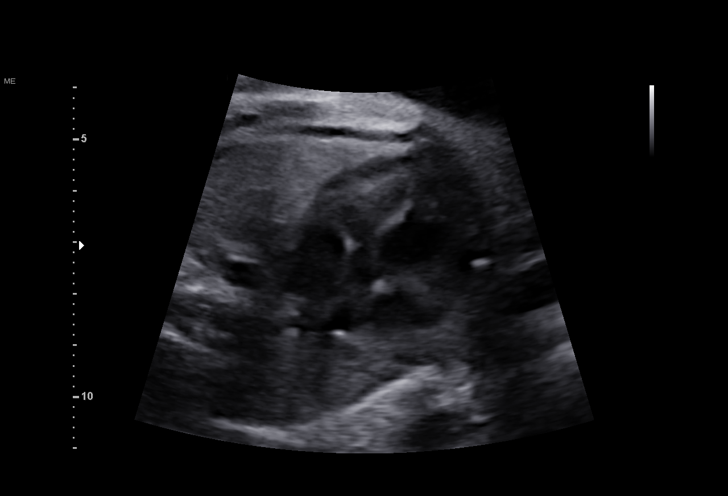
[im 13/25]
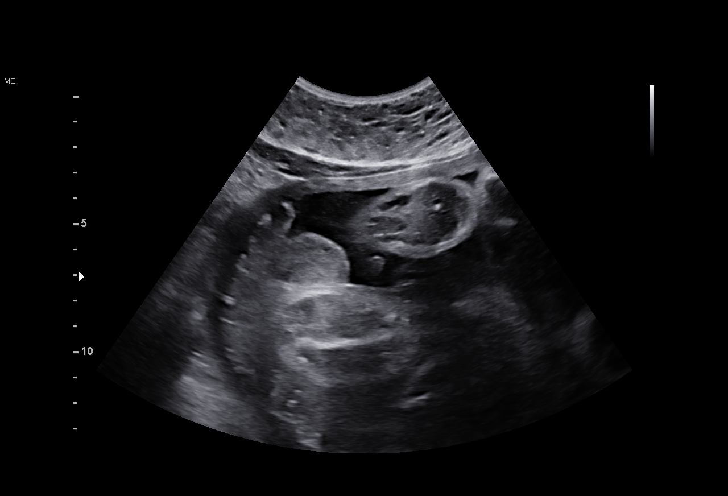
[im 21/25]
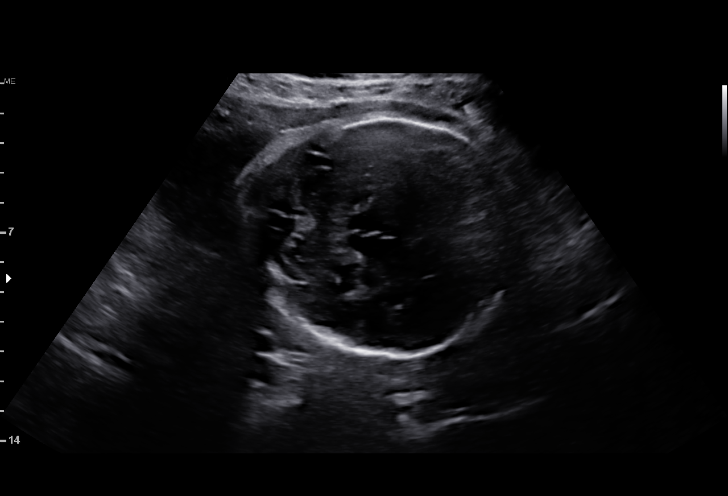

[12 of 28 positions shown; findings below may reference images not displayed]

FRYER

Indications

 Pyelectasis of fetus on prenatal ultrasound
 Obesity complicating pregnancy, third trimester
 29 weeks gestation of pregnancy
Fetal Evaluation

 Num Of Fetuses:          1
 Fetal Heart Rate(bpm):   131
 Cardiac Activity:        Observed
 Presentation:            Cephalic
 Placenta:                Posterior
 P. Cord Insertion:       Visualized, central

 AFI Sum(cm)     %Tile       Largest Pocket(cm)
 12.75           36

 RUQ(cm)       RLQ(cm)        LUQ(cm)        LLQ(cm)

Biometry

 BPD:      77.6   mm     G. Age:  31w 1d         77  %    CI:         74.62  %    70 - 86
                                                          FL/HC:       21.3  %    19.2 -
 HC:      285.1   mm     G. Age:  31w 2d         58  %    HC/AC:       1.06       0.99 -
 AC:      269.2   mm     G. Age:  31w 0d         78  %    FL/BPD:      78.1  %    71 - 87
 FL:       60.6   mm     G. Age:  31w 4d         80  %    FL/AC:       22.5  %    20 - 24
 HUM:      55.3   mm     G. Age:  32w 1d         91  %
 CER:      41.4   mm     G. Age:  33w 0d     > 97.7  %

 LV:         5.6  mm
 CM:         3.8  mm
 Est. FW:    2403   gm    3 lb 13 oz      83  %
Gestational Age

 LMP:            29w 6d       Date:  02/16/20                   EDD:  11/22/20
 U/S Today:      31w 2d                                         EDD:  11/12/20
 Best:           29w 6d    Det. By:  LMP  (02/16/20)            EDD:  11/22/20
Anatomy

 Cranium:                Appears normal         Aortic Arch:            Appears normal
 Cavum:                  Appears normal         Ductal Arch:            Not well visualized
 Ventricles:             Appears normal         Diaphragm:              Appears normal
 Choroid Plexus:         Appears normal         Stomach:                Appears normal, left
                                                                        sided
 Cerebellum:             Appears normal         Abdomen:                Appears normal
 Posterior Fossa:        Appears normal         Abdominal Wall:         Appears nml (cord
                                                                        insert, abd wall)
 Nuchal Fold:            Appears normal         Cord Vessels:           Appears normal (3
                                                                        vessel cord)
 Face:                   Appears normal         Kidneys:                Left sided
                         (orbits and profile)                           pyelectasis,
                                                                        mm
 Lips:                   Appears normal         Bladder:                Appears normal
 Heart:                  Appears normal         Spine:                  Appears normal
                         (4CH, axis, and situs)
 RVOT:                   Appears normal         Upper Extremities:      Appears normal
 LVOT:                   Appears normal         Lower Extremities:      Appears normal

 Other:   Right renal pelvis 4mm.  Left renal pelvis 9mm
Comments

 This patient was seen for a detailed fetal anatomy scan due to
 pyelectasis that was noted on a recent ultrasound performed in
 your office.
 She denies any significant past medical history and denies any
 problems in her current pregnancy.
 She had a cell free DNA test earlier in her pregnancy which
 indicated a low risk for trisomy 21, 18, and 13. A male fetus is
 predicted.
 She was informed that the fetal growth and amniotic fluid level
 were appropriate for her gestational age.
 Left pyelectasis measuring  0.8 to 0.9 cm dilated was noted on
 today's ultrasound exam.  The implications and management of
 pylectasis was discussed with the patient. She was advised
 that the pyelectasis noted on prenatal ultrasounds will often
 resolve spontaneously after birth.  However, there may also be
 other processes such as an obstruction or reflux causing this
 finding that may require treatment after birth.  She was advised
 that we will continue to follow her closely to assess this finding.
 Should the renal pelvis continue to enlarge, we will consider a
 referral to pediatric urology/nephrology for a prenatal
 consultation.
 The small association between pyelectasis and Down
 syndrome was discussed.  She was offered and declined an
 amniocentesis today for definitive diagnosis of fetal aneuploidy.
 She was reassured by the low risk for Down syndrome
 indicated by her cell free DNA test.
 The views of the fetal anatomy were limited today due to her
 advanced gestational age.
 The patient was informed that anomalies may be missed due to
 technical limitations. If the fetus is in a suboptimal position or
 maternal habitus is increased, visualization of the fetus in the
 maternal uterus may be impaired.
 A follow-up exam was scheduled in 5 weeks for assessment
 of the fetal kidneys.
 At the end of the consultation, the patient stated that all of her
 questions had been answered to her complete satisfaction.
 Thank you for referring this patient for a Maternal-Fetal Medicine
 consultation.
 Total time spent in consultation: 35 minutes.

## 2022-07-28 IMAGING — CT CT ANGIO CHEST
2 of 6 series · 17 of 46 positions shown · IV contrast (APPLIED)
Comparison: Radiograph 10/03/2020

CLINICAL DATA: Chest pain radiating to back and under left breast,
increasing with inhalation, positive D-dimer

EXAM:
CT ANGIOGRAPHY CHEST WITH CONTRAST
TECHNIQUE: Multidetector CT imaging of the chest was performed using the
standard protocol during bolus administration of intravenous
contrast. Multiplanar CT image reconstructions and MIPs were
obtained to evaluate the vascular anatomy.
CONTRAST:  75mL OMNIPAQUE IOHEXOL 350 MG/ML SOLN

[Series 6: thins · axial · 0.72mm/px · z∈[-553,-352]mm · 14 of 221 slices shown]
[im 10/221  lung]
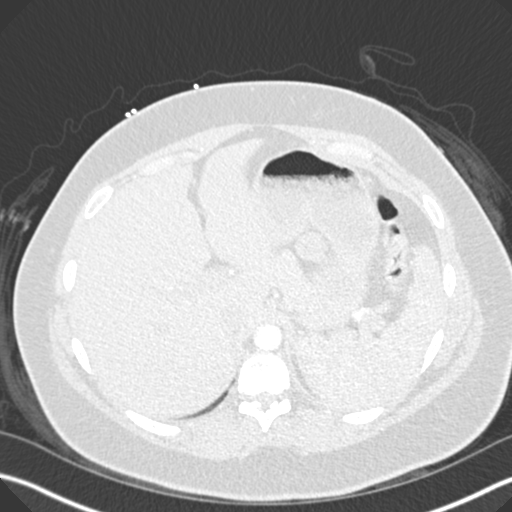
[im 29/221  soft-tissue]
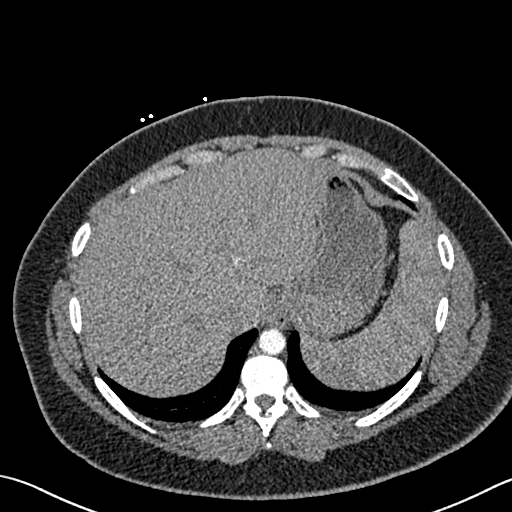
[im 39/221  lung]
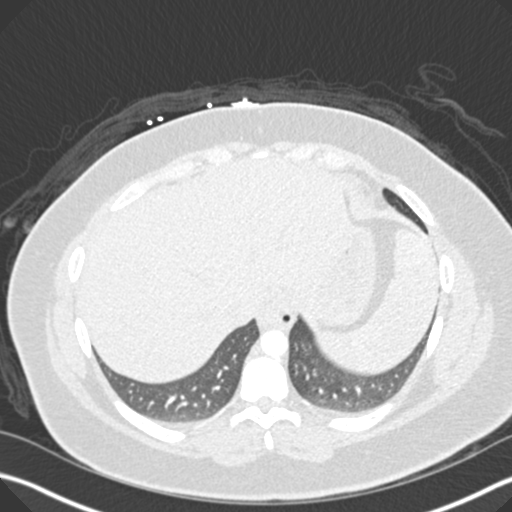
[im 58/221  soft-tissue]
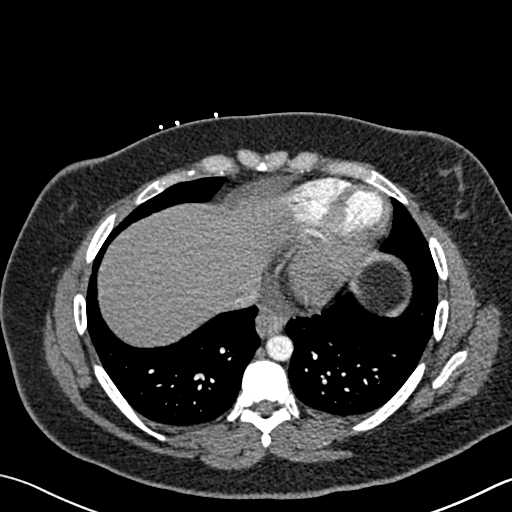
[im 77/221  lung]
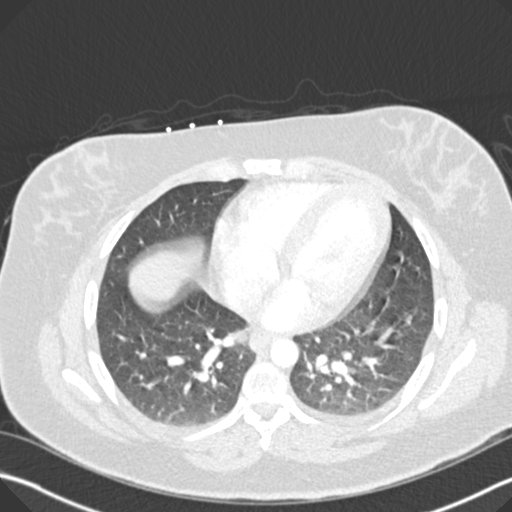
[im 87/221  soft-tissue]
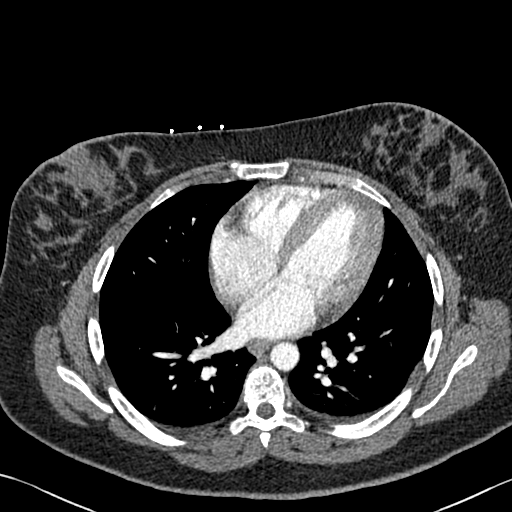
[im 106/221  lung]
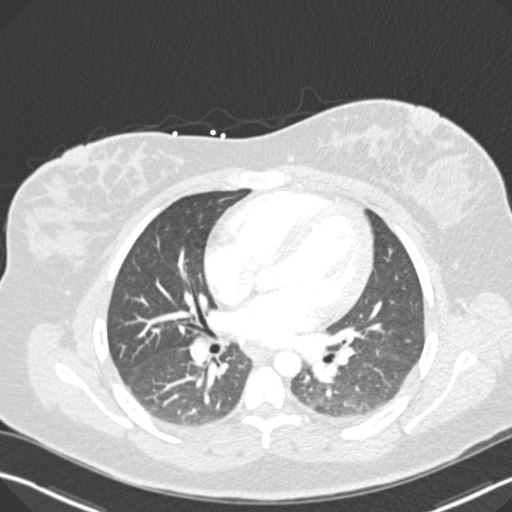
[im 115/221  soft-tissue]
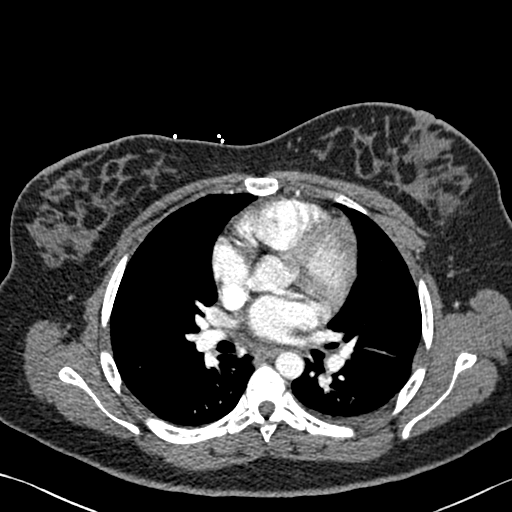
[im 134/221  lung]
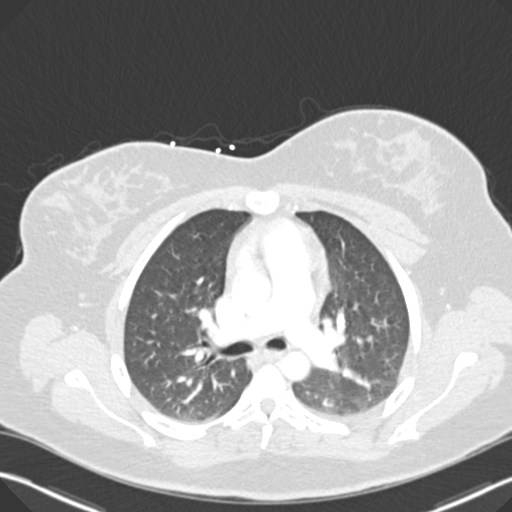
[im 144/221  soft-tissue]
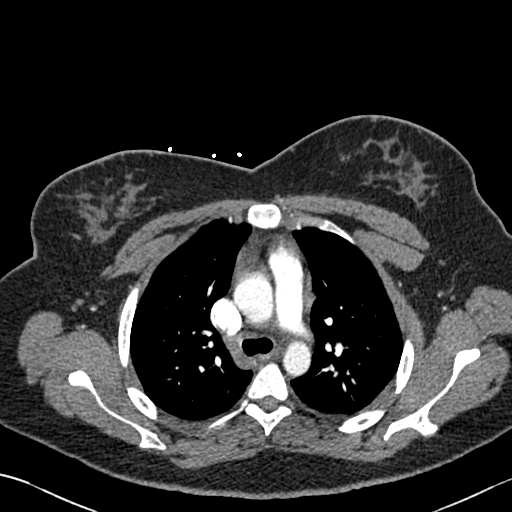
[im 163/221  lung]
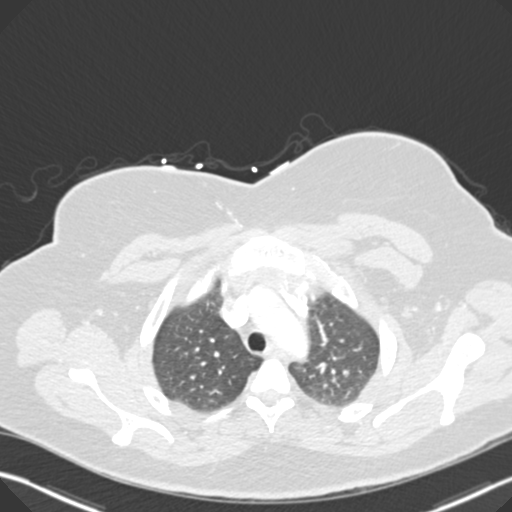
[im 182/221  soft-tissue]
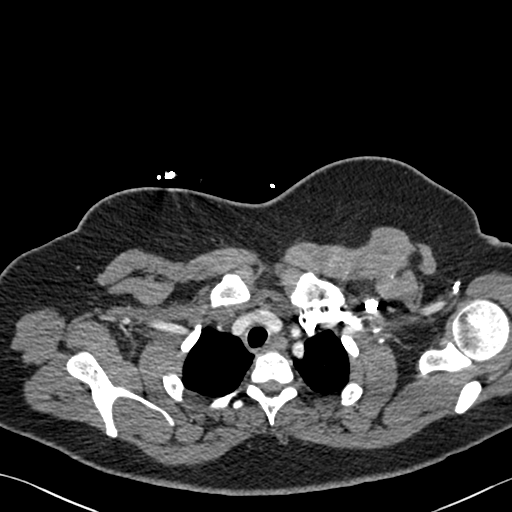
[im 192/221  lung]
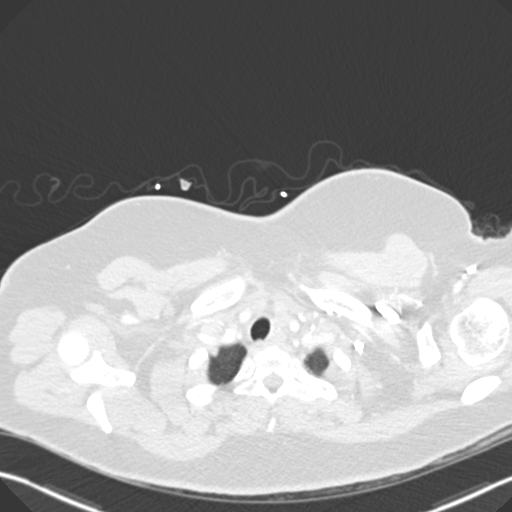
[im 211/221  soft-tissue]
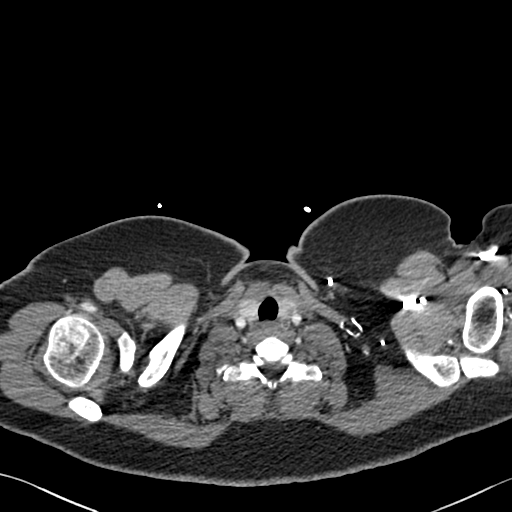

[Series 8: coronal mpr · coronal · 0.46mm/px · 3 of 92 slices shown]
[im 23/92  soft-tissue]
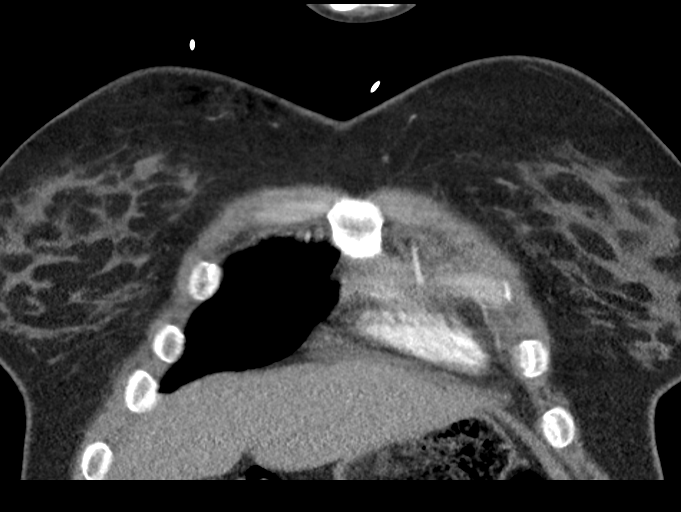
[im 46/92  soft-tissue]
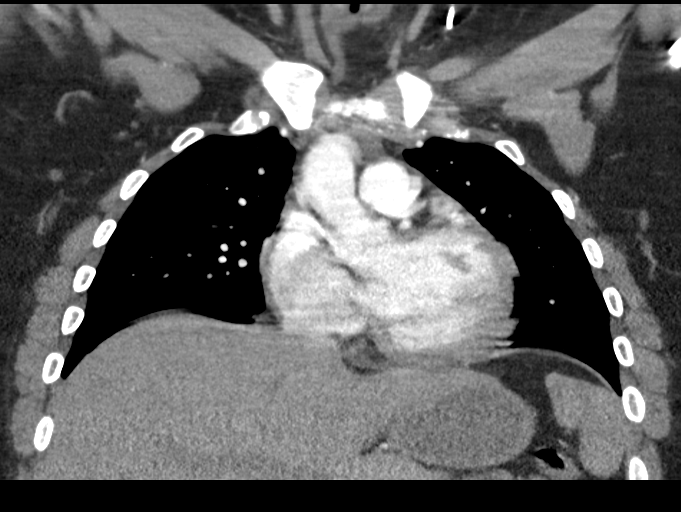
[im 69/92  soft-tissue]
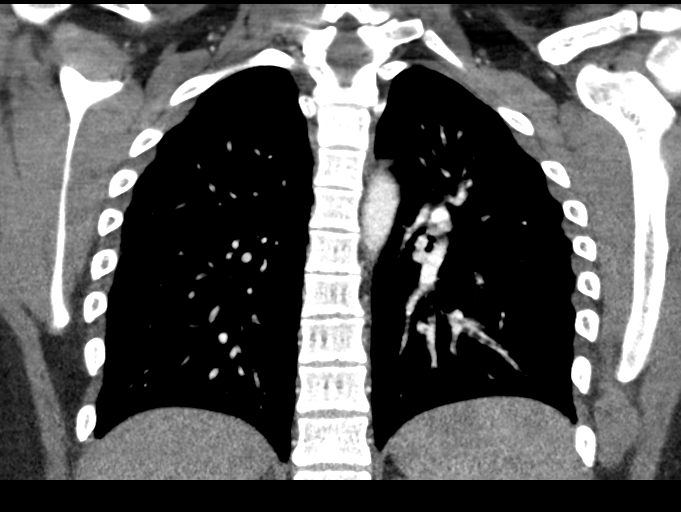

[17 of 46 positions shown; findings below may reference images not displayed]

FINDINGS: Cardiovascular: Satisfactory opacification the pulmonary arteries to
the segmental level. No pulmonary artery filling defects are
identified. Central pulmonary arteries are normal caliber. Normal
heart size. No pericardial effusion. The aortic root is suboptimally
assessed given cardiac pulsation artifact. The aorta is normal
caliber. No acute luminal abnormality of the imaged aorta. No
periaortic stranding or hemorrhage. Shared origin of the
brachiocephalic and left common carotid arteries. Proximal great
vessels are otherwise unremarkable.

Mediastinum/Nodes: Wedge-shaped soft tissue attenuation in the
anterior mediastinum most likely reflective of a thymic remnant in a
patient of this age and in the absence of trauma. No mediastinal
fluid or gas. Normal thyroid gland and thoracic inlet. No acute
abnormality of the trachea. Small sliding-type hiatal hernia. No
worrisome mediastinal, hilar or axillary adenopathy.

Lungs/Pleura: Mild dependent atelectatic changes posteriorly. Trace
bilateral effusions, left greater than right. No pneumothorax. No
consolidation, features of edema, pneumothorax, or effusion. No
suspicious pulmonary nodules or masses.

Upper Abdomen: No acute abnormalities present in the visualized
portions of the upper abdomen. Heterogeneous enhancement the spleen
is typical of arterial phase imaging.

Musculoskeletal: No acute osseous abnormality or suspicious osseous
lesion. No worrisome chest wall masses or lesions.

Review of the MIP images confirms the above findings.
IMPRESSION: 1. No evidence of pulmonary embolism.
2. Trace bilateral pleural effusions, left greater than right.
3. Dependent atelectatic changes.
4. Wedge-shaped soft tissue attenuation in the anterior mediastinum
most likely reflective of a thymic remnant in a patient of this age
and in the absence of trauma.

## 2022-10-27 ENCOUNTER — Ambulatory Visit: Payer: Medicaid Other | Admitting: Obstetrics

## 2022-10-27 ENCOUNTER — Encounter: Payer: Self-pay | Admitting: Obstetrics

## 2022-10-27 VITALS — BP 112/65 | HR 67 | Ht 64.0 in | Wt 178.0 lb

## 2022-10-27 DIAGNOSIS — Z30011 Encounter for initial prescription of contraceptive pills: Secondary | ICD-10-CM

## 2022-10-27 DIAGNOSIS — N946 Dysmenorrhea, unspecified: Secondary | ICD-10-CM | POA: Diagnosis not present

## 2022-10-27 DIAGNOSIS — N920 Excessive and frequent menstruation with regular cycle: Secondary | ICD-10-CM | POA: Insufficient documentation

## 2022-10-27 MED ORDER — NORETHINDRONE 0.35 MG PO TABS: 1.0000 | ORAL_TABLET | Freq: Every day | ORAL | 6 refills | Status: DC

## 2022-10-27 MED ORDER — IBUPROFEN 600 MG PO TABS: 600.0000 mg | ORAL_TABLET | Freq: Four times a day (QID) | ORAL | 3 refills | Status: DC | PRN

## 2022-10-29 NOTE — Progress Notes (Signed)
Obstetrics & Gynecology Office Visit   Chief Complaint:  Chief Complaint  Patient presents with   Dysmenorrhea    History of Present Illness: Monique Barnett is a 27 year old G1 P1, who presents seeking help with heavy , painful periods. She has to change tampons or pads numerous times when on her cycle. She has strong cramping. She routinely passes clots during her menses as well. In the past she has tried to diminish the flow by using combination OCPs, but reports that OCPs make her feel sick, and overly emotional. Her mother and sister both hasve dignses of endometriosis.. She has also tried Motrin dosing when her period starts and has only dosed herself once; complains that this has not helped much with the cramping.   Review of Systems:  Review of Systems  Constitutional: Negative.   HENT: Negative.    Eyes: Negative.   Respiratory: Negative.    Cardiovascular: Negative.   Genitourinary: Negative.   Musculoskeletal: Negative.   Skin: Negative.   Neurological: Negative.   Endo/Heme/Allergies: Negative.   Psychiatric/Behavioral: Negative.         Hx of some situational depression. Used zoloft, but stopped it after several months.     Past Medical History:  Past Medical History:  Diagnosis Date   Anemia in pregnancy, third trimester 09/09/2020   Called by phone and advised to start on an iron tab daily.   Kidney stone     Past Surgical History:  Past Surgical History:  Procedure Laterality Date   MOUTH SURGERY      Gynecologic History: Patient's last menstrual period was 10/18/2022 (exact date).  Obstetric History: G1P1001  Family History:  Family History  Problem Relation Age of Onset   Anxiety disorder Maternal Grandmother    Cancer Maternal Grandfather    Diabetes Paternal Grandfather     Social History:  Social History   Socioeconomic History   Marital status: Significant Other    Spouse name: Josue   Number of children: Not on file   Years of  education: Not on file   Highest education level: Not on file  Occupational History   Not on file  Tobacco Use   Smoking status: Never   Smokeless tobacco: Never  Substance and Sexual Activity   Alcohol use: No   Drug use: No   Sexual activity: Yes    Partners: Male    Birth control/protection: I.U.D.  Other Topics Concern   Not on file  Social History Narrative   Not on file   Social Determinants of Health   Financial Resource Strain: Not on file  Food Insecurity: Not on file  Transportation Needs: Not on file  Physical Activity: Not on file  Stress: Not on file  Social Connections: Not on file  Intimate Partner Violence: Not on file    Allergies:  Allergies  Allergen Reactions   Penicillins Nausea And Vomiting    Medications: Prior to Admission medications   Medication Sig Start Date End Date Taking? Authorizing Provider  ibuprofen (ADVIL) 600 MG tablet Take 1 tablet (600 mg total) by mouth every 6 (six) hours as needed. 10/27/22  Yes Imagene Riches, CNM  norethindrone (MICRONOR) 0.35 MG tablet Take 1 tablet (0.35 mg total) by mouth daily. 10/27/22  Yes Imagene Riches, CNM    Physical Exam Vitals:  Vitals:   10/27/22 0924  BP: 112/65  Pulse: 67   Patient's last menstrual period was 10/18/2022 (exact date).  Physical Exam Vitals reviewed.  Constitutional:      Appearance: Normal appearance. She is normal weight.  HENT:     Head: Normocephalic and atraumatic.  Cardiovascular:     Rate and Rhythm: Normal rate and regular rhythm.     Pulses: Normal pulses.     Heart sounds: Normal heart sounds.  Pulmonary:     Effort: Pulmonary effort is normal.     Breath sounds: Normal breath sounds.  Musculoskeletal:        General: Normal range of motion.     Cervical back: Normal range of motion and neck supple.  Skin:    General: Skin is warm and dry.  Neurological:     General: No focal deficit present.     Mental Status: She is alert and oriented to  person, place, and time.      Assessment: 27 y.o. G1P1001 for consultation on dysmenorrhea and menorrhagia.  Plan: Problem List Items Addressed This Visit       Other   Menorrhagia with regular cycle - Primary   Other Visit Diagnoses     Encounter for initial prescription of contraceptive pills       Relevant Medications   norethindrone (MICRONOR) 0.35 MG tablet   Dysmenorrhea       Relevant Medications   ibuprofen (ADVIL) 600 MG tablet      We discussed options , including using progesterone only contraception. After describing thr use of Nexpklanon, Mirena and Depo, as well as a trial of the progesterone only pill, Cherylanne would prefer to use a mini pill. She will start on Micronor generic, and in addition, track her cycles, starting on regular dosing of Motrin 600 mg po q 6 hours once her menses start. I have also suggested she use heating pads, hot baths or showers for comfort. She is aware that the minipill, as a contraceptive method, must be taken every day, and at approximately the same time.  She will RTC in 3 months for follow up.  Imagene Riches, CNM  10/29/2022 11:42 PM

## 2022-12-09 ENCOUNTER — Other Ambulatory Visit: Payer: Self-pay | Admitting: Nurse Practitioner

## 2022-12-09 DIAGNOSIS — R109 Unspecified abdominal pain: Secondary | ICD-10-CM

## 2022-12-09 DIAGNOSIS — N946 Dysmenorrhea, unspecified: Secondary | ICD-10-CM

## 2022-12-22 ENCOUNTER — Ambulatory Visit
Admission: RE | Admit: 2022-12-22 | Discharge: 2022-12-22 | Disposition: A | Discharge status: Home / Self Care | Payer: Medicaid Other | Source: Ambulatory Visit | Attending: Nurse Practitioner | Admitting: Nurse Practitioner

## 2022-12-22 DIAGNOSIS — N946 Dysmenorrhea, unspecified: Secondary | ICD-10-CM | POA: Insufficient documentation

## 2022-12-22 DIAGNOSIS — R109 Unspecified abdominal pain: Secondary | ICD-10-CM | POA: Diagnosis present

## 2023-07-23 ENCOUNTER — Ambulatory Visit: Payer: Medicaid Other | Admitting: Obstetrics

## 2023-07-23 VITALS — BP 107/71 | HR 74 | Resp 16 | Ht 64.0 in | Wt 173.5 lb

## 2023-07-23 DIAGNOSIS — N920 Excessive and frequent menstruation with regular cycle: Secondary | ICD-10-CM

## 2023-07-23 DIAGNOSIS — Z30011 Encounter for initial prescription of contraceptive pills: Secondary | ICD-10-CM | POA: Diagnosis not present

## 2023-07-23 DIAGNOSIS — R102 Pelvic and perineal pain: Secondary | ICD-10-CM | POA: Diagnosis not present

## 2023-07-23 LAB — POCT URINALYSIS DIPSTICK
Bilirubin, UA: NEGATIVE
Blood, UA: NEGATIVE
Glucose, UA: NEGATIVE
Ketones, UA: NEGATIVE
Leukocytes, UA: NEGATIVE
Nitrite, UA: NEGATIVE
Protein, UA: NEGATIVE
Spec Grav, UA: 1.01 (ref 1.010–1.025)
Urobilinogen, UA: 0.2 U/dL
pH, UA: 6.5 (ref 5.0–8.0)

## 2023-07-23 MED ORDER — LO LOESTRIN FE 1 MG-10 MCG / 10 MCG PO TABS: 1.0000 | ORAL_TABLET | Freq: Every day | ORAL | 3 refills | Status: DC

## 2023-07-23 NOTE — Progress Notes (Signed)
    GYNECOLOGY PROGRESS NOTE  Subjective:    Patient ID: Monique Barnett, female    DOB: 12-14-1995, 27 y.o.   MRN: [id number]  HPI  Patient is a 27 y.o. G36P1001 female who presents for pelvic pain. Pelvic pain x 4 months has increased, on Monday she had severe pain and pressure. She wasn't able to get out of bed, pain lasted about 1 hour, then she was sore for 3 hours. She has pain during her cycle and during ovulation.  She was last seen in February of this year for a c/o heavy cycles with dysmenorrhea. She was averse to being on Estrogen, so started on a minipill. Today she admits that she stopped using the Micronor after several months. She continues to have heavier cycles and cramping.  She denies any vaginal discharge or malodor. She made the appointment today to discuss this one episode of pelvic pain, that has not repeated itself. She is married, has one son who is three, and  uses condoms for birth control. She is not interested in antoher pregnancy at this time.  The following portions of the patient's history were reviewed and updated as appropriate: allergies, current medications, past medical history, past social history, past surgical history, and problem list.  Review of Systems Pertinent items are noted in HPI.   Objective:   unknown if currently breastfeeding. There is no height or weight on file to calculate BMI. General appearance: alert, cooperative, and no distress Abdomen: soft, non-tender; bowel sounds normal; no masses,  no organomegaly Pelvic: cervix normal in appearance, external genitalia normal, no adnexal masses or tenderness, no cervical motion tenderness, rectovaginal septum normal, uterus normal size, shape, and consistency, and vagina normal without discharge Extremities: extremities normal, atraumatic, no cyanosis or edema Neurologic: Grossly normal   Assessment:   Episode of suprapubic pain that lasted several hours.   Hx of heavier periods.  Plan:   Her pelvic exam is without abnormal findings. Suspect that she ahd some Mittleschmerz pain with ovulation has it occurred at ovulation time in her cycle. We discussed her heavier periods today, and I have suggested a atrial of low dose OCPs for her to lighten her bleeds and suppress ovulation.she agrees to this. Samples of Loestrin Lo provided and I will prescribe a years's worth. She is due for her Annual in the Spring and will follow up then. Mirna Mires, CNM  07/23/2023 10:35 AM     Monique Barnett, CNM [facility]

## 2023-09-06 ENCOUNTER — Telehealth: Payer: Self-pay

## 2023-09-06 NOTE — Telephone Encounter (Signed)
 Spoke with patient. She reports she started a new birth control about a month ago and is having irregular bleeding/spotting. She had two periods back to back and has had spotting on/off for 8 days. Advised normal to have irregular bleeding 3-6 months after starting/changing birth control.Advised of heavy bleeding protocol and when to report to Emergency Department.

## 2023-09-06 NOTE — Telephone Encounter (Signed)
 TRIAGE VOICEMAIL: Patient states she was transferred to speak to a nurse.

## 2023-09-23 ENCOUNTER — Ambulatory Visit: Payer: Medicaid Other | Admitting: Advanced Practice Midwife

## 2023-09-29 ENCOUNTER — Other Ambulatory Visit (HOSPITAL_COMMUNITY)
Admission: RE | Admit: 2023-09-29 | Discharge: 2023-09-29 | Disposition: A | Discharge status: Home / Self Care | Payer: Medicaid Other | Source: Ambulatory Visit | Attending: Obstetrics | Admitting: Obstetrics

## 2023-09-29 ENCOUNTER — Encounter: Payer: Self-pay | Admitting: Obstetrics

## 2023-09-29 ENCOUNTER — Ambulatory Visit: Payer: Medicaid Other | Admitting: Obstetrics

## 2023-09-29 VITALS — BP 120/80 | Ht 64.0 in | Wt 172.0 lb

## 2023-09-29 DIAGNOSIS — Z124 Encounter for screening for malignant neoplasm of cervix: Secondary | ICD-10-CM | POA: Insufficient documentation

## 2023-09-29 DIAGNOSIS — F419 Anxiety disorder, unspecified: Secondary | ICD-10-CM | POA: Insufficient documentation

## 2023-09-29 DIAGNOSIS — N920 Excessive and frequent menstruation with regular cycle: Secondary | ICD-10-CM | POA: Diagnosis not present

## 2023-09-29 MED ORDER — LEVONORGEST-ETH ESTRAD 91-DAY 0.15-0.03 &0.01 MG PO TABS: 1.0000 | ORAL_TABLET | Freq: Every day | ORAL | 4 refills | Status: DC
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
## 2023-09-29 NOTE — Addendum Note (Signed)
Addended by: Loman Chroman on: 09/29/2023 02:42 PM   Modules accepted: Orders

## 2023-09-29 NOTE — Addendum Note (Signed)
Addended by: Loman Chroman on: 09/29/2023 02:38 PM   Modules accepted: Orders

## 2023-09-29 NOTE — Addendum Note (Signed)
Addended by: Julieanne Manson on: 09/29/2023 02:26 PM   Modules accepted: Orders

## 2023-09-29 NOTE — Addendum Note (Signed)
Addended by: Loman Chroman on: 09/29/2023 02:33 PM   Modules accepted: Orders

## 2023-10-01 LAB — CYTOLOGY - PAP
Chlamydia: NEGATIVE
Comment: NEGATIVE
Comment: NORMAL
Diagnosis: NEGATIVE
Neisseria Gonorrhea: NEGATIVE

## 2023-10-12 ENCOUNTER — Telehealth: Payer: Medicaid Other | Admitting: Family Medicine

## 2023-10-12 ENCOUNTER — Other Ambulatory Visit: Payer: Self-pay | Admitting: Registered Nurse

## 2023-10-12 DIAGNOSIS — N8301 Follicular cyst of right ovary: Secondary | ICD-10-CM | POA: Insufficient documentation

## 2023-10-12 DIAGNOSIS — N946 Dysmenorrhea, unspecified: Secondary | ICD-10-CM

## 2023-10-12 DIAGNOSIS — R197 Diarrhea, unspecified: Secondary | ICD-10-CM

## 2023-10-12 DIAGNOSIS — E663 Overweight: Secondary | ICD-10-CM | POA: Insufficient documentation

## 2023-10-12 NOTE — Progress Notes (Signed)
  Because this has been on going, you feel weak and might be medication related- you need to be seen face to face. Imodium is what we recommend- but it not working you have to be seen in person. I feel your condition warrants further evaluation and I recommend that you be seen in a face-to-face visit with the provider who started you on the medication or at a local urgent care.   NOTE: There will be NO CHARGE for this E-Visit   If you are having a true medical emergency, please call 911.

## 2023-10-21 ENCOUNTER — Ambulatory Visit: Payer: Medicaid Other | Admitting: Obstetrics

## 2023-10-21 ENCOUNTER — Ambulatory Visit: Payer: Medicaid Other | Admitting: Clinical

## 2023-11-04 ENCOUNTER — Encounter: Payer: Self-pay | Admitting: Obstetrics

## 2023-11-04 ENCOUNTER — Ambulatory Visit: Payer: Medicaid Other | Admitting: Obstetrics

## 2023-11-04 VITALS — BP 102/60 | HR 81 | Ht 64.0 in | Wt 165.0 lb

## 2023-11-04 DIAGNOSIS — Z712 Person consulting for explanation of examination or test findings: Secondary | ICD-10-CM

## 2023-11-04 DIAGNOSIS — Z79899 Other long term (current) drug therapy: Secondary | ICD-10-CM

## 2023-11-04 DIAGNOSIS — F419 Anxiety disorder, unspecified: Secondary | ICD-10-CM

## 2023-11-04 DIAGNOSIS — N920 Excessive and frequent menstruation with regular cycle: Secondary | ICD-10-CM | POA: Diagnosis not present

## 2023-11-04 NOTE — Progress Notes (Signed)
 GYNECOLOGY PROGRESS NOTE  Subjective:  PCP: [facility], [place]  Patient ID: Monique Barnett, female    DOB: 04-05-1996, 28 y.o.   MRN: [id number]  HPI  Patient is a 28 y.o. G76P1001 female who presents for follow up on menorrhagia.  Last seen 09/29/23 and started on COCs to help with HMB. She was put on the pills Amethia 09/29/23 she stopped them on 10/12/23 due to diarrhea and heart palpitations due to stooling up to 5 x day. Once she stopped the OCP, diarrhea resolved. She does not want to be on any new type of BC.   Pt was unable to see [place] sooner for the diarrhea so she sought care with outside provider at [facility] in [place].  She states they saw an [place] she had in April 2024 and were "concerned" by something, maybe a fibroid? And they recommended she have a follow up US, is scheduled 11/08/23. She would like to discuss the 2024 results, was never told anything was abnormal.   Anxiety: pt referred to therapist and scheduled for first visit 2/202/25. She and son came down with stomach virus, had to cancel. Is rescheduled for new visit 3/14.  The following portions of the patient's history were reviewed and updated as appropriate: allergies, current medications, past family history, past medical history, past social history, past surgical history, and problem list.  Review of Systems Pertinent items are noted in HPI.   Objective:   Blood pressure 102/60, pulse 81, height 5\' 4"  (1.626 m), weight 165 lb (74.8 kg), last menstrual period 10/12/2023, unknown if currently breastfeeding. Body mass index is 28.32 kg/m.  General appearance: alert, cooperative, and no distress Pelvic: deferred Extremities: extremities normal, atraumatic, no cyanosis or edema Neurologic: Grossly normal  12/22/22 FINDINGS: Uterus   Measurements: 8.5 x 3.9 x 5.9 cm = volume: 101 mL. No fibroids or other mass visualized.   Endometrium   Thickness: 1.3 mm.  No focal abnormality visualized.   Right  ovary   Measurements: 4.2 x 2.5 x 3.6 cm = volume: 19.3 mL. Contains a 2.9 cm dominant follicle. No follow-up imaging recommended for the follicle.   Left ovary   Measurements: 2.9 x 1.5 x 1.8 cm = volume: 4.1 mL. Normal appearance/no adnexal mass.   Other findings   No abnormal free fluid.   IMPRESSION: No significant abnormalities are identified. No cause for dysmenorrhea visualized.  Assessment/Plan:   1. Menorrhagia with regular cycle   2. Anxiety   3. Follow-up encounter involving medication   4. Encounter to discuss test results     Monique Barnett is a 28 y.o. G1P1001 here for follow up on menorrhagia and questions about a prior US.  Menorrhagia: pt would like to remain off hormonal medications. She is aware there are other options, will contact us if desiring. For now is going to stick with Vit D, B12 and we discussed limited data on Omega-3.  Anxiety: upcoming appointment 3/14, feel will greatly help pt with past trauma [place] from 11/2022 unremarkable, only finding 2.9cm right dominant follicle and no follow up imaging recommended. Provided copy of this result to patient. [place] order for 3/10 states reason is for the R follicle. Encouraged pt to reach out to ordering provider and see if they really want her to have [place] on 3/10, do not recommend.   Total time was 30 minutes. That includes chart review before the visit, the actual patient visit, and time spent on documentation after the visit. Time excludes  procedures, if any.    Follow up Jan '25 for annual, sooner prn.   Julieanne Manson, DO [facility]

## 2023-11-08 ENCOUNTER — Ambulatory Visit

## 2023-11-12 ENCOUNTER — Ambulatory Visit: Payer: Medicaid Other | Admitting: Clinical

## 2023-11-12 DIAGNOSIS — F411 Generalized anxiety disorder: Secondary | ICD-10-CM

## 2023-11-12 DIAGNOSIS — F40298 Other specified phobia: Secondary | ICD-10-CM

## 2023-11-12 DIAGNOSIS — F3341 Major depressive disorder, recurrent, in partial remission: Secondary | ICD-10-CM
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
## 2023-11-12 NOTE — Progress Notes (Signed)
   Monique Barthel, LCSW

## 2023-11-22 ENCOUNTER — Ambulatory Visit (INDEPENDENT_AMBULATORY_CARE_PROVIDER_SITE_OTHER): Admitting: Clinical

## 2023-11-22 DIAGNOSIS — F411 Generalized anxiety disorder: Secondary | ICD-10-CM

## 2023-11-22 DIAGNOSIS — F3341 Major depressive disorder, recurrent, in partial remission: Secondary | ICD-10-CM

## 2023-11-22 DIAGNOSIS — F40298 Other specified phobia: Secondary | ICD-10-CM | POA: Diagnosis not present

## 2023-11-22 NOTE — Progress Notes (Unsigned)
 [facility] Counselor/Therapist Progress Note  Patient ID: Monique Barnett, MRN: [id number]    Date: 11/22/23  Time Spent: 1:34  pm - 2:17 pm : 43 Minutes  Treatment Type: Individual Therapy.  Reported Symptoms: Patient reported ruminating thoughts at times, obsessive thoughts, and compulsions.   Mental Status Exam: Appearance:  Neat and Well Groomed     Behavior: Appropriate  Motor: Normal  Speech/Language:  Clear and Coherent and Normal Rate  Affect: Appropriate  Mood: normal  Thought process: normal  Thought content:   WNL  Sensory/Perceptual disturbances:   WNL  Orientation: oriented to person, place, and situation  Attention: Good  Concentration: Good  Memory: WNL  Fund of knowledge:  Good  Insight:   Good  Judgment:  Good  Impulse Control: Good   Risk Assessment: Danger to Self:  No Patient denied current suicidal ideation  Self-injurious Behavior: No Danger to Others: No Patient denied current homicidal ideation Duty to Warn:no Physical Aggression / Violence:No  Access to Firearms a concern: No  Gang Involvement:No   Subjective:  Patient stated, "its pretty much the same I think" in response to events since last session. Patient stated, "its been fine" in response to mood since last session. Patient stated, "I think I kind of already knew a little bit of that because of the past therapy sessions" in response to diagnoses. Patient reported she does not want take medications. Patient stated,  "I had worse side effects than my actual problems were causing" in response to a consultation with a psychiatrist. Patient stated, "I could hear somebody out" in response a consultation with a psychiatrist and reported she would like to think about a consultation with psychiatrist. Patient stated, "I would like to do therapy". Patient stated, "I've had goals for a really long time for my anxiety that I felt I couldn't reach it through medication or by myself". Patient  reported she experiences anxiety when patient's activities/routine do not go as patient planned. Patient stated, "I feel like its going to throw off my entire day". Patient stated, "I would like to not have such bad social anxiety". Patient reported experiencing anxiety when in social settings due to fear of germs. Patient stated, "I would like to go out and enjoy things with him (son) instead of worry". Patient stated, "I can't enjoy anything until my house is clean" and reported she will clean prior to performing an activity patient enjoys. Patient stated, "I have a really bad habit of having to do some things a certain number of times, it has to be 4-8 times". Patient stated, "I prefer even numbers". Patient reported she spends 30 minutes to one hour per day completing compulsions. Patient stated, "If I don't do it something bad will happen to me" in reference to compulsions. Patient stated, "I feel like my anxiety causes me to be very judgmental of people" and stated, "if they're not like me there's a judgmental tone".   Interventions: Motivational Interviewing. Clinician conducted session via caregility video from clinician's home office. Patient provided verbal consent to proceed with telehealth session and is aware of limitations of telephone or video visits. Patient participated in session from patient's home. Reviewed events since last session and assessed for changes. Clinician reviewed diagnoses and treatment recommendations. Provided psycho education related to diagnoses and treatment. Clinician utilized motivational interviewing to explore potential goals for therapy.   Collaboration of Care: Patient declined to complete consents for other providers at this time   Diagnosis:  Generalized anxiety  disorder  Specific phobia  Major depressive disorder, recurrent, in partial remission (HCC)  R/O OCD   Plan: Goals to be developed during patient's follow up appointment on 12/06/23                      Doree Barthel, LCSW

## 2023-12-06 ENCOUNTER — Encounter: Payer: Self-pay | Admitting: Clinical

## 2023-12-06 ENCOUNTER — Ambulatory Visit (INDEPENDENT_AMBULATORY_CARE_PROVIDER_SITE_OTHER): Admitting: Clinical

## 2023-12-06 DIAGNOSIS — F3341 Major depressive disorder, recurrent, in partial remission: Secondary | ICD-10-CM

## 2023-12-06 DIAGNOSIS — F40298 Other specified phobia: Secondary | ICD-10-CM | POA: Diagnosis not present

## 2023-12-06 DIAGNOSIS — F411 Generalized anxiety disorder: Secondary | ICD-10-CM | POA: Diagnosis not present

## 2023-12-06 NOTE — Progress Notes (Signed)
 [facility] Counselor/Therapist Progress Note  Patient ID: Monique Barnett, MRN: [id number]    Date: 12/06/23  Time Spent: 3:30  pm - 4:24 pm : 54 Minutes  Treatment Type: Individual Therapy.  Reported Symptoms: Patient reported recent feelings of sadness  Mental Status Exam: Appearance:  Neat and Well Groomed     Behavior: Appropriate  Motor: Normal  Speech/Language:  Clear and Coherent and Normal Rate  Affect: Appropriate  Mood: normal  Thought process: normal  Thought content:   WNL  Sensory/Perceptual disturbances:   WNL  Orientation: oriented to person, place, and situation  Attention: Good  Concentration: Good  Memory: WNL  Fund of knowledge:  Good  Insight:   Good  Judgment:  Good  Impulse Control: Good   Risk Assessment: Danger to Self:  No Patient denied current suicidal ideation  Self-injurious Behavior: No Danger to Others: No Patient denied current homicidal ideation Duty to Warn:no Physical Aggression / Violence:No  Access to Firearms a concern: No  Gang Involvement:No   Subjective:  Patient stated, "I had a pretty not great two weeks", "I was sad", "I had my cycle and they're pretty bad" in response to events since last session. Patient reported significant other is "pushing for [place] to work things out in a time period" and reported significant other would like patient/significant other to obtain a house together and get married. Patient reported patient's mother would like patient to move to patient's hometown for support and to assist with caring for patient's grandmother. Patient stated, "its fine" in response to patient's current mood. Patient reported she feels she avoids participating in activities patient enjoys, emotions, physical contact, and loving feelings. Patient stated,  "I think I'm very detached". Patient reported she does not have a relationship with patient's sister or father. Patient stated, "being happy makes me anxious" and  reported fear if she experiences happiness something bad will follow.   Interventions: Motivational Interviewing. Clinician conducted session via caregility video from clinician's home office. Patient provided verbal consent to proceed with telehealth session and is aware of limitations of telephone or video visits. Patient participated in session from patient's home. Reviewed events since last session and assessed for changes. Clinician utilized motivational interviewing to explore potential goals for therapy. Clinician utilized a task centered approach in collaboration with patient to develop goals for therapy. Patient participated in development of goals and agreed to goals for therapy.   Collaboration of Care: Other not required at this time  Diagnosis:  Generalized anxiety disorder  Specific phobia  Major depressive disorder, recurrent, in partial remission (HCC)   Plan: Patient is to utilize [employer] [facility], thought re-framing, relaxation techniques, behavioral activation, and coping strategies to decrease symptoms associated with their diagnosis. Frequency: bi-weekly  Modality: individual     Long-term goal:   Reduce overall level, frequency, and intensity of the feelings of depression and anxiety as evidenced by decreased sadness, lack of energy, loss of interest, difficulty staying asleep, feeling overwhelmed and anxious, fear of contacting an illness, panic attacks,  lack of appetite, muscle tension, restlessness, difficulty concentrating, worry from 7 days/week to 0 to 1 days/week per patient report for at least 3 consecutive months. Target Date: 12/05/24  Progress: established 12/06/23   Short-term goal:  Decrease symptoms of anxiety when changes occur in patient's plans/schedule per patient's report Target Date: 06/06/24  Progress: established 12/06/23   Decrease symptoms of anxiety and feelings of fear related to germs in public settings per patient's report Target  Date: 06/06/24  Progress: established 12/06/23   Increase patient's participation in activities outside of the home patient enjoys from 1-2 times per month to 4 times per month  Target Date: 06/06/24  Progress: established 12/06/23   Identify, challenge, and replace negative thought patterns/cognitive distortions and negative self talk that contribute to feelings of anxiety and depression with positive thoughts, beliefs, and positive self talk per patient's report Target Date: 06/06/24  Progress: established 12/06/23   Decrease obsessive thoughts and compulsions to clean areas of patient's home prior to participation in other activities per patient's report Target Date: 06/06/24  Progress: established 12/06/23   Decrease negative thoughts and feelings of resentment towards patient's significant other and significant other's previous partner Target Date: 06/06/24  Progress: established 12/06/23                      Doree Barthel, LCSW

## 2023-12-24 ENCOUNTER — Telehealth: Payer: Self-pay

## 2023-12-24 ENCOUNTER — Ambulatory Visit: Admitting: Clinical

## 2023-12-24 DIAGNOSIS — F411 Generalized anxiety disorder: Secondary | ICD-10-CM

## 2023-12-24 DIAGNOSIS — F3341 Major depressive disorder, recurrent, in partial remission: Secondary | ICD-10-CM | POA: Diagnosis not present

## 2023-12-24 DIAGNOSIS — F40298 Other specified phobia: Secondary | ICD-10-CM | POA: Diagnosis not present

## 2023-12-24 DIAGNOSIS — N946 Dysmenorrhea, unspecified: Secondary | ICD-10-CM

## 2023-12-24 MED ORDER — IBUPROFEN 600 MG PO TABS: 600.0000 mg | ORAL_TABLET | Freq: Four times a day (QID) | ORAL | 3 refills | Status: DC | PRN
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
## 2023-12-24 NOTE — Progress Notes (Signed)
   Doree Barthel, LCSW

## 2023-12-24 NOTE — Telephone Encounter (Signed)
 Monique Barnett

## 2024-01-05 NOTE — Progress Notes (Unsigned)
    [facility]   No chief complaint on file.   HPI:      Ms. Monique Barnett is a 28 y.o. G1P1001 whose LMP was No LMP recorded., presents today for ***    Patient Active Problem List   Diagnosis Date Noted   Anxiety 09/29/2023   Menorrhagia with regular cycle 10/27/2022   Situational depression 07/19/2020    Past Surgical History:  Procedure Laterality Date   MOUTH SURGERY      Family History  Problem Relation Age of Onset   Anxiety disorder Maternal Grandmother    Cancer Maternal Grandfather    Breast cancer Maternal Grandfather 84   Diabetes Paternal Grandfather     Social History   Socioeconomic History   Marital status: Significant Other    Spouse name: Josue   Number of children: Not on file   Years of education: Not on file   Highest education level: Not on file  Occupational History   Not on file  Tobacco Use   Smoking status: Never   Smokeless tobacco: Never  Vaping Use   Vaping status: Never Used  Substance and Sexual Activity   Alcohol use: No   Drug use: No   Sexual activity: Yes    Partners: Male  Other Topics Concern   Not on file  Social History Narrative   Not on file   Social Drivers of Health   Financial Resource Strain: Not on file  Food Insecurity: Not on file  Transportation Needs: Not on file  Physical Activity: Not on file  Stress: Not on file  Social Connections: Unknown (01/10/2022)   Received from [facility]   Social Network    Social Network: Not on file  Intimate Partner Violence: Not At Risk (01/07/2023)   Received from [facility]   HITS    Over the last 12 months how often did your partner physically hurt you?: Never    Over the last 12 months how often did your partner insult you or talk down to you?: Never    Over the last 12 months how often did your partner threaten you with physical harm?: Never    Over the last 12 months how often did your partner scream or curse at you?:  Never    Outpatient Medications Prior to Visit  Medication Sig Dispense Refill   ibuprofen  (ADVIL ) 600 MG tablet Take 1 tablet (600 mg total) by mouth every 6 (six) hours as needed. 60 tablet 3   Levonorgestrel-Ethinyl Estradiol (AMETHIA) 0.15-0.03 &0.01 MG tablet Take 1 tablet by mouth daily. (Patient not taking: Reported on 11/04/2023) 91 tablet 4   No facility-administered medications prior to visit.      ROS:  Review of Systems BREAST: No symptoms   OBJECTIVE:   Vitals:  There were no vitals taken for this visit.  Physical Exam  Results: No results found for this or any previous visit (from the past 24 hours).   Assessment/Plan: No diagnosis found.    No orders of the defined types were placed in this encounter.     No follow-ups on file.  Dannica Bickham B. Anzel Kearse, PA-C 01/05/2024 8:50 PM

## 2024-01-06 ENCOUNTER — Encounter: Payer: Self-pay | Admitting: Obstetrics and Gynecology

## 2024-01-06 ENCOUNTER — Ambulatory Visit: Admitting: Obstetrics and Gynecology

## 2024-01-06 VITALS — BP 107/67 | HR 156 | Ht 64.0 in | Wt 167.0 lb

## 2024-01-06 DIAGNOSIS — N644 Mastodynia: Secondary | ICD-10-CM | POA: Diagnosis not present

## 2024-01-06 NOTE — Patient Instructions (Signed)
 I value your feedback and you entrusting [place] with your care. If you get a [place] patient survey, I would appreciate you taking the time to let us know about your experience today. Thank you! ? ? ?

## 2024-01-07 ENCOUNTER — Ambulatory Visit (INDEPENDENT_AMBULATORY_CARE_PROVIDER_SITE_OTHER): Admitting: Clinical

## 2024-01-07 DIAGNOSIS — F411 Generalized anxiety disorder: Secondary | ICD-10-CM

## 2024-01-07 DIAGNOSIS — F3341 Major depressive disorder, recurrent, in partial remission: Secondary | ICD-10-CM

## 2024-01-07 DIAGNOSIS — F40298 Other specified phobia: Secondary | ICD-10-CM | POA: Diagnosis not present

## 2024-01-07 NOTE — Progress Notes (Signed)
   Monique Barthel, LCSW

## 2024-01-07 NOTE — Progress Notes (Signed)
 [facility] Counselor/Therapist Progress Note  Patient ID: Monique Barnett, MRN: [id number],    Date: 01/07/2024  Time Spent: 8:33am - 9:29am : 56 minutes   Treatment Type: Individual Therapy  Reported Symptoms: Patient reported recent feelings of anxiety and depression  Mental Status Exam: Appearance:  Neat and Well Groomed     Behavior: Appropriate  Motor: Normal  Speech/Language:  Clear and Coherent and Normal Rate  Affect: Appropriate  Mood: normal  Thought process: normal  Thought content:   WNL  Sensory/Perceptual disturbances:   WNL  Orientation: oriented to person, place, time/date, and situation  Attention: Good  Concentration: Good  Memory: WNL  Fund of knowledge:  Good  Insight:   Good  Judgment:  Good  Impulse Control: Good   Risk Assessment: Danger to Self:  No Patient denied current suicidal ideation  Self-injurious Behavior: No Danger to Others: No Patient denied current homicidal ideation Duty to Warn:no Physical Aggression / Violence:No  Access to Firearms a concern: No  Gang Involvement:No   Subjective: Patient stated, "they've been ok, I've had a lot of anxiety, a little bit of depression since last week". Patient reported she will be moving in November and was able to tour the house patient will be moving into in November. Patient reported feeling anxious regarding the move due to patient's previous experience living with patient's mother. Patient stated, "its good" in response to patient's current mood. Patient reported patient will be attending a graduation party and was informed significant other's previous partner will be attending the party. Patient reported she has considered not attending the party or attending the party and leaving early. Patient stated, "I was mad" in response to significant other's previous partner attending the party. Patient reported experiencing the thought, "why does she have to be every where", "I don't want her  to see my son". Patient reported a fear of having "a reaction out of me that I don't want to display".   Patient stated, "I think a bully really bothers me" and patient reported she feels significant other's previous partner displays bullying behaviors. Patient reported she has observed bullying behaviors in previous interactions with other individuals.   Interventions: Cognitive Behavioral Therapy. Clinician conducted session via caregility video from clinician's home office. Patient provided verbal consent to proceed with telehealth session and is aware of limitations of telephone or video visits. Patient participated in session from patient's home.  Reviewed events since last session and assessed for changes. Assisted patient in exploring and identifying triggers for recent feelings of anxiety and depression. Assisted patient in exploring and identifying thoughts and feelings triggered by upcoming party.  Discussed coping strategies to utilize in response to interaction with significant other's previous partner, such as, patient excusing herself from the conversation and walking away from the situation. Clinician requested for homework patient complete a thought record.   Collaboration of Care: Other not required at this time   Diagnosis:  Generalized anxiety disorder   Specific phobia   Major depressive disorder, recurrent, in partial remission (HCC)     Plan: Patient is to utilize [employer] Therapy, thought re-framing, relaxation techniques, behavioral activation, and coping strategies to decrease symptoms associated with their diagnosis. Frequency: bi-weekly  Modality: individual      Long-term goal:   Reduce overall level, frequency, and intensity of the feelings of depression and anxiety as evidenced by decreased sadness, lack of energy, loss of interest, difficulty staying asleep, feeling overwhelmed and anxious, fear of contacting an illness, panic  attacks,  lack of appetite,  muscle tension, restlessness, difficulty concentrating, worry from 7 days/week to 0 to 1 days/week per patient report for at least 3 consecutive months. Target Date: 12/05/24  Progress: progressing    Short-term goal:  Decrease symptoms of anxiety when changes occur in patient's plans/schedule per patient's report Target Date: 06/06/24  Progress: progressing    Decrease symptoms of anxiety and feelings of fear related to germs in public settings per patient's report Target Date: 06/06/24  Progress: progressing    Increase patient's participation in activities outside of the home patient enjoys from 1-2 times per month to 4 times per month  Target Date: 06/06/24  Progress: progressing    Identify, challenge, and replace negative thought patterns/cognitive distortions and negative self talk that contribute to feelings of anxiety and depression with positive thoughts, beliefs, and positive self talk per patient's report Target Date: 06/06/24  Progress: progressing    Decrease obsessive thoughts and compulsions to clean areas of patient's home prior to participation in other activities per patient's report Target Date: 06/06/24  Progress: progressing    Decrease negative thoughts and feelings of resentment towards patient's significant other and significant other's previous partner Target Date: 06/06/24  Progress: progressing                        Burlene Carpen, LCSW

## 2024-01-14 ENCOUNTER — Telehealth: Payer: Self-pay

## 2024-01-14 ENCOUNTER — Other Ambulatory Visit: Payer: Self-pay

## 2024-01-14 DIAGNOSIS — N644 Mastodynia: Secondary | ICD-10-CM

## 2024-01-14 NOTE — Telephone Encounter (Signed)
 Pt called triage stating she needed Evalyn Hillier to order a ultrasound for her breast. Pt aware Evalyn Hillier is not in the office today.

## 2024-01-16 NOTE — Telephone Encounter (Signed)
 I placed the breast u/s order. Pt to call [facility] ([place])--[phone] To schedule. Pls inform pt. Thx.

## 2024-01-17 ENCOUNTER — Ambulatory Visit: Admitting: Clinical

## 2024-01-17 DIAGNOSIS — F40298 Other specified phobia: Secondary | ICD-10-CM

## 2024-01-17 DIAGNOSIS — F411 Generalized anxiety disorder: Secondary | ICD-10-CM

## 2024-01-17 DIAGNOSIS — F3341 Major depressive disorder, recurrent, in partial remission: Secondary | ICD-10-CM | POA: Diagnosis not present

## 2024-01-17 NOTE — Telephone Encounter (Signed)
 Called pt, no answer, L[facility]TRC.

## 2024-01-17 NOTE — Progress Notes (Signed)
   Doree Barthel, LCSW

## 2024-01-17 NOTE — Progress Notes (Signed)
 [facility] Counselor/Therapist Progress Note  Patient ID: Sydnie Sigmund, MRN: [id number],    Date: 01/17/2024  Time Spent: 3:31pm - 4:20pm : 49 minutes   Treatment Type: Individual Therapy  Reported Symptoms: Patient reported recent feelings of anxiety and fear  Mental Status Exam: Appearance:  Neat and Well Groomed     Behavior: Appropriate  Motor: Normal  Speech/Language:  Clear and Coherent and Normal Rate  Affect: Appropriate  Mood: normal  Thought process: normal  Thought content:   WNL  Sensory/Perceptual disturbances:   WNL  Orientation: oriented to person, place, and situation  Attention: Good  Concentration: Good  Memory: WNL  Fund of knowledge:  Good  Insight:   Good  Judgment:  Good  Impulse Control: Good   Risk Assessment: Danger to Self:  No Patient denied current suicidal ideation  Self-injurious Behavior: No Danger to Others: No Patient denied current homicidal ideation Duty to Warn:no Physical Aggression / Violence:No  Access to Firearms a concern: No  Gang Involvement:No   Subjective: Patient stated, "I had a good birthday and I had a good mother's day". Patient reported patient attended the party and patient reported she had a plan to cope with potential interaction with significant other's previous partner. Patient reported significant other's previous partner attended the party. Patient stated, "It really didn't bother me that much", "I just kind of laughed the whole time".  Patient reported she did not experience anxiety in response to the party or mother's day/patient's birthday. Patient reported she has been practicing pilates recently and reported feeling pilates has a positive impact on patient's physical and mental health. Patient reported experiencing anxiety at night and fear that someone is in patient's home. Patient reported checking the house before returning to sleep. Patient reported a history of break in's and reported  patient's father had verbal altercations at night during patient's childhood. Patient reported she received therapy as a child due to patient experiencing difficulty sleeping at night. Patient reported feeling scared and mad when significant other's daughter disclosed significant other's previous partner inquired when patient visits a local business. Patient  reported she changed her membership and location of the business she visits in response to the inquiry. Patient reported the situation triggered thoughts related to a history of significant other's previous partner following patient home. Patient stated, "It really connected a lot of things, I think I block off memories until I'm writing" in response to completing patient's thought record. Patient stated, "the journaling has helped me feel a little comforting".   Interventions: Cognitive Behavioral Therapy. Clinician conducted session via caregility video from clinician's home office. Patient provided verbal consent to proceed with telehealth session and is aware of limitations of telephone or video visits. Patient participated in session from patient's home.  Reviewed events since last session and assessed for changes. Discussed patient's response to the presence of significant other's previous partner at the party. Reviewed patient's thought record. Explored and identified triggers, feelings, and thoughts associated with entries in patient's thought record. Provided psycho education related to connections between thoughts, feelings, and behaviors. Discussed patient's response to completing patient's thought record. Clinician requested for homework patient continue thought record.    Collaboration of Care: Other not required at this time   Diagnosis:  Generalized anxiety disorder   Specific phobia   Major depressive disorder, recurrent, in partial remission (HCC)     Plan: Patient is to utilize [employer] Therapy, thought re-framing,  relaxation techniques, behavioral activation, and coping strategies to  decrease symptoms associated with their diagnosis. Frequency: bi-weekly  Modality: individual      Long-term goal:   Reduce overall level, frequency, and intensity of the feelings of depression and anxiety as evidenced by decreased sadness, lack of energy, loss of interest, difficulty staying asleep, feeling overwhelmed and anxious, fear of contacting an illness, panic attacks,  lack of appetite, muscle tension, restlessness, difficulty concentrating, worry from 7 days/week to 0 to 1 days/week per patient report for at least 3 consecutive months. Target Date: 12/05/24  Progress: progressing    Short-term goal:  Decrease symptoms of anxiety when changes occur in patient's plans/schedule per patient's report Target Date: 06/06/24  Progress: progressing    Decrease symptoms of anxiety and feelings of fear related to germs in public settings per patient's report Target Date: 06/06/24  Progress: progressing    Increase patient's participation in activities outside of the home patient enjoys from 1-2 times per month to 4 times per month  Target Date: 06/06/24  Progress: progressing    Identify, challenge, and replace negative thought patterns/cognitive distortions and negative self talk that contribute to feelings of anxiety and depression with positive thoughts, beliefs, and positive self talk per patient's report Target Date: 06/06/24  Progress: progressing    Decrease obsessive thoughts and compulsions to clean areas of patient's home prior to participation in other activities per patient's report Target Date: 06/06/24  Progress: progressing    Decrease negative thoughts and feelings of resentment towards patient's significant other and significant other's previous partner Target Date: 06/06/24  Progress: progressing                Burlene Carpen, LCSW

## 2024-01-19 NOTE — Telephone Encounter (Signed)
 Called pt, no answer, [facility]. Sending mychart msg.

## 2024-01-28 ENCOUNTER — Ambulatory Visit: Admitting: Clinical

## 2024-01-28 DIAGNOSIS — F40298 Other specified phobia: Secondary | ICD-10-CM

## 2024-01-28 DIAGNOSIS — F411 Generalized anxiety disorder: Secondary | ICD-10-CM | POA: Diagnosis not present

## 2024-01-28 DIAGNOSIS — F3341 Major depressive disorder, recurrent, in partial remission: Secondary | ICD-10-CM

## 2024-01-28 NOTE — Progress Notes (Signed)
   Monique Barthel, LCSW

## 2024-01-28 NOTE — Progress Notes (Signed)
 [facility] Counselor/Therapist Progress Note  Patient ID: Monique Barnett, MRN: [id number],    Date: 01/28/2024  Time Spent: 2:31pm - 3:27pm : 56 minutes   Treatment Type: Individual Therapy  Reported Symptoms: Patient reported recent fluctuations in mood and anxiety  Mental Status Exam: Appearance:  Neat and Well Groomed     Behavior: Appropriate  Motor: Normal  Speech/Language:  Clear and Coherent and Normal Rate  Affect: Appropriate  Mood: normal  Thought process: normal  Thought content:   WNL  Sensory/Perceptual disturbances:   WNL  Orientation: oriented to person, place, time/date, and situation  Attention: Good  Concentration: Good  Memory: WNL  Fund of knowledge:  Good  Insight:   Good  Judgment:  Good  Impulse Control: Good   Risk Assessment: Danger to Self:  No Patient denied current suicidal ideation  Self-injurious Behavior: No Danger to Others: No Patient denied current homicidal ideation Duty to Warn:no Physical Aggression / Violence:No  Access to Firearms a concern: No  Gang Involvement:No   Subjective: Patient stated, " a little up and down for the past couple of weeks", "a lot of anxiety" in response to mood since last session. 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 Patient stated, "that kind of made me more depressed than anxiety, I have gotten really sad and beat up myself" in reference to studying for exam. Patient reported experiencing the thoughts, "that I'm not really smart", "I am just really mean to myself" in response to difficulty studying. Patient reported she is critical of herself. Patient reported patient's neighbor recently left patient a note regarding noise from patient's apartment and patient stated, "I just couldn't let it go". Patient reported the letter triggered feelings of anger and anxiety. Patient stated, "I feel like it's not going to  be the last time" and reported she feels the neighbor will write another letter. Patient stated, "I think she might have had a bad day", stressed, "I think she's young and doesn't have a kid and doesn't understand", may have never lived in an apartment, maturity, and lack of sleep as other explanations for neighbor's behavior.  Patient stated, "I genuinely feel kind of bad for her", "I do understand her". Patient reported frequent fear of something bad happening and stated, "I always think the worst thing". Patient stated, "its ok" in response to today's mood.   Interventions: Cognitive Behavioral Therapy. Clinician conducted session via caregility video from clinician's home office. Patient provided verbal consent to proceed with telehealth session and is aware of limitations of telephone or video visits. Patient participated in session from patient's home.  Reviewed events since last session and assessed for changes. Discussed recent triggers for depressed mood and anxiety. Explored thoughts associated with depressed mood and triggers. Reviewed patient's thought record. Provided psycho education related to challenging negative thoughts/cognitive distortions and negative/positive self talk. Processed patient's thoughts and feelings regarding neighbor's letter. Provided psycho education related to generating alternatives and assisted patient in generating alternatives to the situation regarding patient's neighbor. Provided psycho education related to the cycle of anxiety and anxiety. Clinician requested for homework patient continue thought record and practice challenging negative thoughts/cognitive distortions.    Collaboration of Care: Other not required at this time   Diagnosis:  Generalized anxiety disorder   Specific phobia   Major depressive disorder, recurrent, in partial remission (HCC)     Plan: Patient is to utilize [employer]  Therapy, thought re-framing, relaxation techniques,  behavioral activation, and coping strategies to decrease symptoms associated with their diagnosis. Frequency: bi-weekly  Modality: individual      Long-term goal:   Reduce overall level, frequency, and intensity of the feelings of depression and anxiety as evidenced by decreased sadness, lack of energy, loss of interest, difficulty staying asleep, feeling overwhelmed and anxious, fear of contacting an illness, panic attacks,  lack of appetite, muscle tension, restlessness, difficulty concentrating, worry from 7 days/week to 0 to 1 days/week per patient report for at least 3 consecutive months. Target Date: 12/05/24  Progress: progressing    Short-term goal:  Decrease symptoms of anxiety when changes occur in patient's plans/schedule per patient's report Target Date: 06/06/24  Progress: progressing    Decrease symptoms of anxiety and feelings of fear related to germs in public settings per patient's report Target Date: 06/06/24  Progress: progressing    Increase patient's participation in activities outside of the home patient enjoys from 1-2 times per month to 4 times per month  Target Date: 06/06/24  Progress: progressing    Identify, challenge, and replace negative thought patterns/cognitive distortions and negative self talk that contribute to feelings of anxiety and depression with positive thoughts, beliefs, and positive self talk per patient's report Target Date: 06/06/24  Progress: progressing    Decrease obsessive thoughts and compulsions to clean areas of patient's home prior to participation in other activities per patient's report Target Date: 06/06/24  Progress: progressing    Decrease negative thoughts and feelings of resentment towards patient's significant other and significant other's previous partner Target Date: 06/06/24  Progress: progressing              Burlene Carpen, LCSW

## 2024-02-05 ENCOUNTER — Ambulatory Visit

## 2024-02-06 ENCOUNTER — Ambulatory Visit

## 2024-02-09 ENCOUNTER — Ambulatory Visit

## 2024-02-11 ENCOUNTER — Ambulatory Visit (INDEPENDENT_AMBULATORY_CARE_PROVIDER_SITE_OTHER): Admitting: Clinical

## 2024-02-11 DIAGNOSIS — F411 Generalized anxiety disorder: Secondary | ICD-10-CM | POA: Diagnosis not present

## 2024-02-11 DIAGNOSIS — F40298 Other specified phobia: Secondary | ICD-10-CM

## 2024-02-11 DIAGNOSIS — F3341 Major depressive disorder, recurrent, in partial remission: Secondary | ICD-10-CM

## 2024-02-11 NOTE — Progress Notes (Signed)
 [facility] Counselor/Therapist Progress Note  Patient ID: Kylan Veach, MRN: [id number],    Date: 02/11/2024  Time Spent: 9:32am - 10:33am : 61 minutes   Treatment Type: Individual Therapy  Reported Symptoms: sadness, anxiety  Mental Status Exam: Appearance:  Neat and Well Groomed     Behavior: Appropriate  Motor: Normal  Speech/Language:  Clear and Coherent and Normal Rate  Affect: Tearful  Mood: sad  Thought process: normal  Thought content:   WNL  Sensory/Perceptual disturbances:   WNL  Orientation: oriented to person, place, time/date, and situation  Attention: Good  Concentration: Good  Memory: WNL  Fund of knowledge:  Good  Insight:   Good  Judgment:  Good  Impulse Control: Good   Risk Assessment: Danger to Self:  No Patient denied current suicidal ideation  Self-injurious Behavior: No Danger to Others: No Patient denied current homicidal ideation Duty to Warn:no Physical Aggression / Violence:No  Access to Firearms a concern: No  Gang Involvement:No   Subjective: Patient stated, I've had a very awful two weeks, it was a lot of anxiety, depression, and bad things kept happening. Patient stated, when it came to homework I didn't do a lot of it and reported feeling overwhelmed. Patient reported patient's son and patient were sick and patient's son was in the hospital recently. Patient stated, it was not the best in reference to events since last session. Patient reported patient experiences stomach aches frequently and reported physicians have indicated stomach aches are a symptom of anxiety. Patient reported concerns regarding stomach aches.  Patient reported a history of panic attacks. Patient reported patient was in a relationship with an individual prior to patient's current relationship. Patient reported patient recently reached out to the individual and met with the individual from patient's previous relationship. Patient stated, it gave me a  lot of anxiety in reference to conversation/meeting with individual. Patient stated, it was nice to talk to another adult and reported anger towards significant other triggered patient to reach out to the individual. Patient reported feeling hurt in response to significant other's communication with his daughter's mother and patient stated, I think I was really hurt when I did it. Patient stated, it was a bad decision in reference to communication with previous significant other.  Patient stated, I've been angry a lot these past two weeks. Patient reported experiencing thoughts of one little thing happens and the rest of the year's ruined and patient reported she has observed her mother catastrophizing situations. Patient stated, it has been helping in response to challenging cognitive distortions/negative thoughts.   Interventions: Cognitive Behavioral Therapy. Clinician conducted session via caregility video from clinician's home office. Patient provided verbal consent to proceed with telehealth session and is aware of limitations of telephone or video visits. Patient participated in session from patient's home.  Reviewed events since last session and assessed for changes. Discussed recent stressors and barriers to completing thought record. Provided psycho education related to anxiety. Discussed following up with PCP to discuss patient's concerns related to stomach aches. Reviewed patient's thought record entries. Processed patient's recent decision/behaviors and explored/identified triggers for patient's behaviors. Assisted patient in generating alternatives in response to patient's thoughts. Challenged statements/thoughts to assist patient in reframing situations. Explored family dynamics,  the impact on patient's thinking styles, and similarities in thinking styles. Reviewed challenging cognitive distortions/negative thoughts and the outcome. Clinician requested for homework patient continue  thought record and practice challenging negative thoughts/cognitive distortions and generating alternatives.    Collaboration of Care:  Other not required at this time   Diagnosis:  Generalized anxiety disorder   Specific phobia   Major depressive disorder, recurrent, in partial remission (HCC)     Plan: Patient is to utilize [employer] Therapy, thought re-framing, relaxation techniques, behavioral activation, and coping strategies to decrease symptoms associated with their diagnosis. Frequency: bi-weekly  Modality: individual      Long-term goal:   Reduce overall level, frequency, and intensity of the feelings of depression and anxiety as evidenced by decreased sadness, lack of energy, loss of interest, difficulty staying asleep, feeling overwhelmed and anxious, fear of contacting an illness, panic attacks,  lack of appetite, muscle tension, restlessness, difficulty concentrating, worry from 7 days/week to 0 to 1 days/week per patient report for at least 3 consecutive months. Target Date: 12/05/24  Progress: progressing    Short-term goal:  Decrease symptoms of anxiety when changes occur in patient's plans/schedule per patient's report Target Date: 06/06/24  Progress: progressing    Decrease symptoms of anxiety and feelings of fear related to germs in public settings per patient's report Target Date: 06/06/24  Progress: progressing    Increase patient's participation in activities outside of the home patient enjoys from 1-2 times per month to 4 times per month  Target Date: 06/06/24  Progress: progressing    Identify, challenge, and replace negative thought patterns/cognitive distortions and negative self talk that contribute to feelings of anxiety and depression with positive thoughts, beliefs, and positive self talk per patient's report Target Date: 06/06/24  Progress: progressing    Decrease obsessive thoughts and compulsions to clean areas of patient's home prior to  participation in other activities per patient's report Target Date: 06/06/24  Progress: progressing    Decrease negative thoughts and feelings of resentment towards patient's significant other and significant other's previous partner Target Date: 06/06/24  Progress: progressing              Burlene Carpen, LCSW

## 2024-02-11 NOTE — Progress Notes (Signed)
   Monique Barthel, LCSW

## 2024-02-25 ENCOUNTER — Ambulatory Visit (INDEPENDENT_AMBULATORY_CARE_PROVIDER_SITE_OTHER): Admitting: Clinical

## 2024-02-25 DIAGNOSIS — F40298 Other specified phobia: Secondary | ICD-10-CM | POA: Diagnosis not present

## 2024-02-25 DIAGNOSIS — F411 Generalized anxiety disorder: Secondary | ICD-10-CM | POA: Diagnosis not present

## 2024-02-25 DIAGNOSIS — F3341 Major depressive disorder, recurrent, in partial remission: Secondary | ICD-10-CM

## 2024-02-25 NOTE — Progress Notes (Signed)
 [facility] Counselor/Therapist Progress Note  Patient ID: Monique Barnett, MRN: [id number],    Date: 02/25/2024  Time Spent: 9:35am - 10:34am : 59 minutes   Treatment Type: Individual Therapy  Reported Symptoms: depressed mood  Mental Status Exam: Appearance:  Neat and Well Groomed     Behavior: Appropriate  Motor: Normal  Speech/Language:  Clear and Coherent and Normal Rate  Affect: Tearful  Mood: depressed  Thought process: normal  Thought content:   WNL  Sensory/Perceptual disturbances:   WNL  Orientation: oriented to person, place, time/date, and situation  Attention: Good  Concentration: Good  Memory: WNL  Fund of knowledge:  Good  Insight:   Good  Judgment:  Good  Impulse Control: Good   Risk Assessment: Danger to Self:  No Patient denied current suicidal ideation  Self-injurious Behavior: No Danger to Others: No Patient denied current homicidal ideation Duty to Warn:no Physical Aggression / Violence:No  Access to Firearms a concern: No  Gang Involvement:No   Subjective: Patient stated probably the worst couple of weeks I've had in a very very long time in response to events since last session. Patient reported patient and significant other have been arguing frequently. Patient reported recent conflict with significant other has been triggered by situations related to significant other's daughter, daughter's mother, and significant other's family. Patient reported conflict with significant other's sister throughout the duration of their relationship and during a recent visit. Patient reported crying and feeling angry in response to significant other's daughter's response to a bathing suit and earrings patient purchased for significant other's daughter. Patient reported patient was upset and felt patient's purchases were unappreciated. Patient stated, I feel really used by them, I really felt that from his daughter this weekend and stated, it is  really hurtful. 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 Patient reported patient enjoys reading outside of patient's home. Patient stated, I don't know what I enjoy. Patient reported patient has considered taking a course at the local community college after patient moves. Patient stated, Jesus had a lot of not good feelings , its been a very lonely and depressed two weeks. Patient stated,  It really does help for me to get it out in reference to therapy sessions.   Interventions: Cognitive Behavioral Therapy. Clinician conducted session via caregility video from clinician's home office. Patient provided verbal consent to proceed with telehealth session and is aware of limitations of telephone or video visits. Patient participated in session from patient's home.  Reviewed events since last session and assessed for changes. Explored and identified triggers for recent decline in mood, conflict with significant other's sister, and conflict with significant other. Reviewed thought record entries and processed recent entries. Explored and identified thoughts/feelings triggered by recent situations.  Challenged statements/thoughts to assist patient in reframing situations. Provided psycho education related to self care. Explored strategies to increase self care, such as, attending a gym that offers childcare, going to [employer]. Clinician requested for homework patient continue thought record and practice challenging negative thoughts/cognitive distortions and generating alternatives.    Collaboration of Care: Other not required at this time   Diagnosis:  Generalized anxiety disorder   Specific phobia    Major depressive  disorder, recurrent, in partial remission (HCC)     Plan: Patient is to utilize [employer] Therapy, thought re-framing, relaxation techniques, behavioral activation, and coping strategies to decrease symptoms associated with their diagnosis. Frequency: bi-weekly  Modality: individual      Long-term goal:   Reduce overall level, frequency, and intensity of the feelings of depression and anxiety as evidenced by decreased sadness, lack of energy, loss of interest, difficulty staying asleep, feeling overwhelmed and anxious, fear of contacting an illness, panic attacks,  lack of appetite, muscle tension, restlessness, difficulty concentrating, worry from 7 days/week to 0 to 1 days/week per patient report for at least 3 consecutive months. Target Date: 12/05/24  Progress: progressing    Short-term goal:  Decrease symptoms of anxiety when changes occur in patient's plans/schedule per patient's report Target Date: 06/06/24  Progress: progressing    Decrease symptoms of anxiety and feelings of fear related to germs in public settings per patient's report Target Date: 06/06/24  Progress: progressing    Increase patient's participation in activities outside of the home patient enjoys from 1-2 times per month to 4 times per month  Target Date: 06/06/24  Progress: progressing    Identify, challenge, and replace negative thought patterns/cognitive distortions and negative self talk that contribute to feelings of anxiety and depression with positive thoughts, beliefs, and positive self talk per patient's report Target Date: 06/06/24  Progress: progressing    Decrease obsessive thoughts and compulsions to clean areas of patient's home prior to participation in other activities per patient's report Target Date: 06/06/24  Progress: progressing    Decrease negative thoughts and feelings of resentment towards patient's significant other and significant other's previous  partner Target Date: 06/06/24  Progress: progressing         Darice Seats, LCSW

## 2024-02-25 NOTE — Progress Notes (Signed)
   Monique Barthel, LCSW

## 2024-03-02 DIAGNOSIS — Z842 Family history of other diseases of the genitourinary system: Secondary | ICD-10-CM | POA: Insufficient documentation

## 2024-03-02 DIAGNOSIS — F429 Obsessive-compulsive disorder, unspecified: Secondary | ICD-10-CM | POA: Insufficient documentation

## 2024-03-02 DIAGNOSIS — F411 Generalized anxiety disorder: Secondary | ICD-10-CM | POA: Insufficient documentation

## 2024-03-06 ENCOUNTER — Encounter: Payer: Self-pay | Admitting: Family Medicine

## 2024-03-06 ENCOUNTER — Ambulatory Visit: Admitting: Family Medicine

## 2024-03-06 VITALS — BP 116/68 | HR 67 | Resp 16 | Ht 64.0 in | Wt 163.0 lb

## 2024-03-06 DIAGNOSIS — R197 Diarrhea, unspecified: Secondary | ICD-10-CM | POA: Diagnosis not present

## 2024-03-06 DIAGNOSIS — R109 Unspecified abdominal pain: Secondary | ICD-10-CM

## 2024-03-06 DIAGNOSIS — F419 Anxiety disorder, unspecified: Secondary | ICD-10-CM

## 2024-03-06 DIAGNOSIS — Z0000 Encounter for general adult medical examination without abnormal findings: Secondary | ICD-10-CM

## 2024-03-06 DIAGNOSIS — N920 Excessive and frequent menstruation with regular cycle: Secondary | ICD-10-CM

## 2024-03-06 DIAGNOSIS — R002 Palpitations: Secondary | ICD-10-CM | POA: Diagnosis not present

## 2024-03-06 NOTE — Patient Instructions (Signed)
 Genesight testing - you can look into it if you are ever in a place where you are considering medications again but you want more info  If there is a delay getting into GI and you are interested in pursuing other GI or stool testing in the meantime please let me know and I can work on getting other appropriate tests done while waiting for GI.

## 2024-03-06 NOTE — Progress Notes (Signed)
 Name: Monique Barnett   MRN: [id number]    DOB: [birth year]   Date:03/06/2024       Progress Note  Chief Complaint  Patient presents with   Establish Care   Anxiety   Consult    Side effects from OCP- heart palpitations around periods and loose stools. Has stopped OCP, but still having sx.     Subjective:   Monique Barnett is a 28 y.o. female, presents to clinic to est care  Cc symptoms around mentrual cycles vs OCP SE? Seeing OBGYN Dr. Leigh and Bernarda copeland  Anxiety -    03/06/2024    2:23 PM  Depression screen PHQ 2/9  Decreased Interest 0  Down, Depressed, Hopeless 0  PHQ - 2 Score 0  Altered sleeping 0  Tired, decreased energy 1  Change in appetite 1  Feeling bad or failure about yourself  0  Trouble concentrating 0  Moving slowly or fidgety/restless 0  Suicidal thoughts 0  PHQ-9 Score 2      03/06/2024    2:23 PM 09/29/2023   10:30 AM  GAD 7 : Generalized Anxiety Score  Nervous, Anxious, on Edge 1 3  Control/stop worrying 1 3  Worry too much - different things 3 3  Trouble relaxing 1 3  Restless 0 1  Easily annoyed or irritable 0 1  Afraid - awful might happen 3 3  Total GAD 7 Score 9 17  Anxiety Difficulty  Not difficult at all      2022 she started birth control after delivery (March - NVD) then did IUD which had complications later that year started OCP which from that point cause upset stomach, diarrhea food sensitivities (Junel, mini pills) stopped all hormone pills since February 2025 and no improvement in 5 months  Palpitations the week before menses usually in the setting of diarrhea  Other SE with pills was palpitations, diarrhea  Food triggers include dairy after pregnancy And spicy foods cause indigestion - uses tums and follows a diet to avoid food triggers   Mom sister and grandma all are very sensitive with medications and birth control  Birth control made her very sick Zoloft  tore her nerves up       Current Outpatient  Medications:    ibuprofen  (ADVIL ) 600 MG tablet, Take 1 tablet by mouth every 6 (six) hours as needed., Disp: , Rfl:   Patient Active Problem List   Diagnosis Date Noted   Family history of endometriosis 03/02/2024   Generalized anxiety disorder 03/02/2024   Obsessive-compulsive disorder 03/02/2024   Follicular cyst of right ovary 10/12/2023   Overweight 10/12/2023   Anxiety 09/29/2023   Menorrhagia with regular cycle 10/27/2022   Situational depression 07/19/2020    Past Surgical History:  Procedure Laterality Date   MOUTH SURGERY      Family History  Problem Relation Age of Onset   Anxiety disorder Maternal Grandmother    Breast cancer Maternal Grandmother 47       Stage 4   Lung cancer Maternal Grandfather 58   Diabetes Paternal Grandfather     Social History   Tobacco Use   Smoking status: Never   Smokeless tobacco: Never  Vaping Use   Vaping status: Never Used  Substance Use Topics   Alcohol use: Never   Drug use: No     Allergies  Allergen Reactions   Penicillins Nausea And Vomiting, Anaphylaxis and Other (See Comments)    Penicillin    Health Maintenance  Topic Date  Due   COVID-19 Vaccine (1) 03/18/2024 (Originally 01/07/2000)   DTaP/Tdap/Td (7 - Td or Tdap) 03/02/2025 (Originally 10/30/2018)   Hepatitis B Vaccines (1 of 3 - 19+ 3-dose series) 03/02/2025 (Originally 01/06/2014)   INFLUENZA VACCINE  03/31/2024   Cervical Cancer Screening (HPV/Pap Cotest)  09/28/2026   HPV VACCINES  Completed   Hepatitis C Screening  Completed   HIV Screening  Completed   Meningococcal B Vaccine  Aged Out    Chart Review Today: I personally reviewed active problem list, medication list, allergies, family history, social history, health maintenance, notes from last encounter, lab results, imaging with the patient/caregiver today.   Review of Systems  Constitutional: Negative.   HENT: Negative.    Eyes: Negative.   Respiratory: Negative.    Cardiovascular: Negative.    Gastrointestinal: Negative.   Endocrine: Negative.   Genitourinary: Negative.   Musculoskeletal: Negative.   Skin: Negative.   Allergic/Immunologic: Negative.   Neurological: Negative.   Hematological: Negative.   Psychiatric/Behavioral: Negative.    All other systems reviewed and are negative.    Objective:   Vitals:   03/06/24 1415  BP: 116/68  Pulse: 67  Resp: 16  SpO2: 98%  Weight: 163 lb (73.9 kg)  Height: 5' 4 (1.626 m)    Body mass index is 27.98 kg/m.  Physical Exam Vitals and nursing note reviewed.  Constitutional:      General: She is not in acute distress.    Appearance: Normal appearance. She is well-developed. She is not ill-appearing, toxic-appearing or diaphoretic.  HENT:     Head: Normocephalic and atraumatic.     Right Ear: External ear normal.     Left Ear: External ear normal.     Nose: Nose normal.  Eyes:     General: No scleral icterus.       Right eye: No discharge.        Left eye: No discharge.     Conjunctiva/sclera: Conjunctivae normal.  Neck:     Trachea: No tracheal deviation.  Cardiovascular:     Rate and Rhythm: Normal rate and regular rhythm.     Pulses: Normal pulses.     Heart sounds: Normal heart sounds.  Pulmonary:     Effort: Pulmonary effort is normal. No respiratory distress.     Breath sounds: Normal breath sounds. No stridor. No wheezing, rhonchi or rales.  Abdominal:     General: Bowel sounds are normal. There is no distension.     Palpations: Abdomen is soft.     Tenderness: There is no abdominal tenderness. There is no guarding or rebound.  Skin:    General: Skin is warm and dry.     Capillary Refill: Capillary refill takes less than 2 seconds.     Findings: No rash.  Neurological:     Mental Status: She is alert.     Motor: No abnormal muscle tone.     Coordination: Coordination normal.     Gait: Gait normal.  Psychiatric:        Mood and Affect: Mood normal.        Behavior: Behavior normal.       Functional Status Survey: Is the patient deaf or have difficulty hearing?: No Does the patient have difficulty seeing, even when wearing glasses/contacts?: No Does the patient have difficulty concentrating, remembering, or making decisions?: No Does the patient have difficulty walking or climbing stairs?: No Does the patient have difficulty dressing or bathing?: No Does the patient have difficulty doing  errands alone such as visiting a doctor's office or shopping?: No Results for orders placed or performed in visit on 09/29/23  Cytology - PAP   Collection Time: 09/29/23 10:57 AM  Result Value Ref Range   Neisseria Gonorrhea Negative    Chlamydia Negative    Adequacy      Satisfactory for evaluation; transformation zone component PRESENT.   Diagnosis      - Negative for intraepithelial lesion or malignancy (NILM)   Comment Normal Reference Ranger Chlamydia - Negative    Comment      Normal Reference Range Neisseria Gonorrhea - Negative   Social History   Socioeconomic History   Marital status: Significant Other    Spouse name: Josue   Number of children: Not on file   Years of education: Not on file   Highest education level: Not on file  Occupational History   Not on file  Tobacco Use   Smoking status: Never   Smokeless tobacco: Never  Vaping Use   Vaping status: Never Used  Substance and Sexual Activity   Alcohol use: Never   Drug use: No   Sexual activity: Yes    Partners: Male    Birth control/protection: Condom  Other Topics Concern   Not on file  Social History Narrative   Not on file   Social Drivers of Health   Financial Resource Strain: Low Risk  (03/06/2024)   Overall Financial Resource Strain (CARDIA)    Difficulty of Paying Living Expenses: Not hard at all  Food Insecurity: No Food Insecurity (03/06/2024)   Hunger Vital Sign    Worried About Running Out of Food in the Last Year: Never true    Ran Out of Food in the Last Year: Never true   Transportation Needs: No Transportation Needs (03/06/2024)   PRAPARE - Administrator, Civil Service (Medical): No    Lack of Transportation (Non-Medical): No  Physical Activity: Insufficiently Active (03/06/2024)   Exercise Vital Sign    Days of Exercise per Week: 3 days    Minutes of Exercise per Session: 30 min  Stress: No Stress Concern Present (03/06/2024)   [employer] of Occupational Health - Occupational Stress Questionnaire    Feeling of Stress: Not at all  Social Connections: Moderately Isolated (03/06/2024)   Social Connection and Isolation Panel    Frequency of Communication with Friends and Family: More than three times a week    Frequency of Social Gatherings with Friends and Family: More than three times a week    Attends Religious Services: More than 4 times per year    Active Member of [employer] or Organizations: No    Attends Banker Meetings: Never    Marital Status: Never married  Intimate Partner Violence: Not At Risk (03/06/2024)   Humiliation, Afraid, Rape, and Kick questionnaire    Fear of Current or Ex-Partner: No    Emotionally Abused: No    Physically Abused: No    Sexually Abused: No      Assessment & Plan:   Problem List Items Addressed This Visit     Menorrhagia with regular cycle   Continue management with OBGYN      Relevant Orders   CBC with Differential/Platelet (Completed)   Anxiety   GAD positive, she is not interested in medications at this time Screen TSH and labs       Relevant Orders   Thyroid  Panel With TSH (Completed)   Other Visit Diagnoses  Encounter for medical examination to establish care    -  Primary   reviewed chart - here for GI upset/eval/moods     Palpitations       consider zio monitor around when she has sx - right before periods, TSH and basic labs ordered today   Relevant Orders   CBC with Differential/Platelet (Completed)   Comprehensive metabolic panel with GFR (Completed)    Thyroid  Panel With TSH (Completed)   LONG TERM MONITOR (3-14 DAYS)     Diarrhea, unspecified type       chronic and intermittent since on OCPs, food sensitivities, keep food journal, labs today, GI consult   Relevant Orders   CBC with Differential/Platelet (Completed)   Comprehensive metabolic panel with GFR (Completed)   Thyroid  Panel With TSH (Completed)   Lipase (Completed)   Celiac Disease Panel (Completed)   Ambulatory referral to Gastroenterology     Abdominal pain, unspecified abdominal location       intermittent, no sig ttp on exam today   Relevant Orders   CBC with Differential/Platelet (Completed)   Comprehensive metabolic panel with GFR (Completed)   Lipase (Completed)   Celiac Disease Panel (Completed)   Ambulatory referral to Gastroenterology         Return for As needed if not improving.   Michelene Cower, PA-C 03/06/24 2:31 PM

## 2024-03-07 ENCOUNTER — Ambulatory Visit: Payer: Self-pay | Admitting: Family Medicine

## 2024-03-08 LAB — COMPREHENSIVE METABOLIC PANEL WITH GFR
AG Ratio: 1.8 (calc) (ref 1.0–2.5)
ALT: 11 U/L (ref 6–29)
AST: 12 U/L (ref 10–30)
Albumin: 4.6 g/dL (ref 3.6–5.1)
Alkaline phosphatase (APISO): 71 U/L (ref 31–125)
BUN: 13 mg/dL (ref 7–25)
CO2: 26 mmol/L (ref 20–32)
Calcium: 9.5 mg/dL (ref 8.6–10.2)
Chloride: 103 mmol/L (ref 98–110)
Creat: 0.8 mg/dL (ref 0.50–0.96)
Globulin: 2.6 g/dL (ref 1.9–3.7)
Glucose, Bld: 89 mg/dL (ref 65–99)
Potassium: 4.4 mmol/L (ref 3.5–5.3)
Sodium: 139 mmol/L (ref 135–146)
Total Bilirubin: 0.4 mg/dL (ref 0.2–1.2)
Total Protein: 7.2 g/dL (ref 6.1–8.1)
eGFR: 102 mL/min/1.73m2 (ref >=60)

## 2024-03-08 LAB — CELIAC DISEASE PANEL
(tTG) Ab, IgA: <1 U/mL
(tTG) Ab, IgG: <1 U/mL
Gliadin IgA: <1 U/mL
Gliadin IgG: <1 U/mL
Immunoglobulin A: 177 mg/dL (ref 47–310)

## 2024-03-08 LAB — CBC WITH DIFFERENTIAL/PLATELET
Absolute Lymphocytes: 1755 {cells}/uL (ref 850–3900)
Absolute Monocytes: 313 {cells}/uL (ref 200–950)
Basophils Absolute: 32 {cells}/uL (ref 0–200)
Basophils Relative: 0.6 %
Eosinophils Absolute: 70 {cells}/uL (ref 15–500)
Eosinophils Relative: 1.3 %
HCT: 36.8 % (ref 35.0–45.0)
Hemoglobin: 12.3 g/dL (ref 11.7–15.5)
MCH: 31.5 pg (ref 27.0–33.0)
MCHC: 33.4 g/dL (ref 32.0–36.0)
MCV: 94.1 fL (ref 80.0–100.0)
MPV: 10.3 fL (ref 7.5–12.5)
Monocytes Relative: 5.8 %
Neutro Abs: 3229 {cells}/uL (ref 1500–7800)
Neutrophils Relative %: 59.8 %
Platelets: 236 Thousand/uL (ref 140–400)
RBC: 3.91 Million/uL (ref 3.80–5.10)
RDW: 11.8 % (ref 11.0–15.0)
Total Lymphocyte: 32.5 %
WBC: 5.4 Thousand/uL (ref 3.8–10.8)

## 2024-03-08 LAB — LIPASE: Lipase: 18 U/L (ref 7–60)

## 2024-03-08 LAB — THYROID PANEL WITH TSH
Free Thyroxine Index: 2 (ref 1.4–3.8)
T3 Uptake: 32 % (ref 22–35)
T4, Total: 6.4 ug/dL (ref 5.1–11.9)
TSH: 1.93 m[IU]/L

## 2024-03-10 ENCOUNTER — Ambulatory Visit (INDEPENDENT_AMBULATORY_CARE_PROVIDER_SITE_OTHER): Admitting: Clinical

## 2024-03-10 DIAGNOSIS — F411 Generalized anxiety disorder: Secondary | ICD-10-CM | POA: Diagnosis not present

## 2024-03-10 DIAGNOSIS — F3341 Major depressive disorder, recurrent, in partial remission: Secondary | ICD-10-CM

## 2024-03-10 DIAGNOSIS — F40298 Other specified phobia: Secondary | ICD-10-CM | POA: Diagnosis not present

## 2024-03-10 NOTE — Progress Notes (Signed)
   Doree Barthel, LCSW

## 2024-03-10 NOTE — Progress Notes (Signed)
 [facility] Counselor/Therapist Progress Note  Patient ID: Monique Barnett, MRN: [id number],    Date: 03/10/2024  Time Spent: 9:32am - 10:36am : 64 minutes  Treatment Type: Individual Therapy  Reported Symptoms: depressed mood  Mental Status Exam: Appearance:  Neat and Well Groomed     Behavior: Appropriate  Motor: Normal  Speech/Language:  Clear and Coherent and Normal Rate  Affect: Tearful  Mood: depressed  Thought process: normal  Thought content:   WNL  Sensory/Perceptual disturbances:   WNL  Orientation: oriented to person, place, time/date, and situation  Attention: Good  Concentration: Good  Memory: WNL  Fund of knowledge:  Good  Insight:   Good  Judgment:  Good  Impulse Control: Good   Risk Assessment: Danger to Self:  No Patient denied current suicidal ideation  Self-injurious Behavior: No Danger to Others: No Patient denied current homicidal ideation Duty to Warn:no Physical Aggression / Violence:No  Access to Firearms a concern: No  Gang Involvement:No   Subjective: Patient stated, its been great and then horrible in response to events since last session.  Patient stated, my vacation went really really good. Patient reported experiencing anxiety prior to patient's vacation and stated, the reality of coming back here in reference to trigger for recent depressive symptoms. Patient stated, I wasn't on edge,  I felt very relaxed in reference to vacation. Patient reported feeling uncomfortable spending time with significant other's daughter and patient reported patient will be spending time with significant other's daughter this weekend. Patient stated, I think I had fun for the first time in years in reference to recent vacation.  Patient stated, I feel responsible all the time and stated, when she's (significant other's daughter) around suddenly I'm mommy to two. Patient stated, I guess I'm kind of angry and reported feeling angry due to  increased responsibility when spending time with significant other's daughter. Patient stated, having to step up and be that parent and feedback regarding significant other's daughter's mother are triggers for decline in mood. Patient reported experiencing a depressive episode since returning from vacation. Patient reported patient didn't want to get out of bed and stated, I was just very sad. Patient stated, I feel really empty. Patient reported feeling angry in response to significant other's daughter's comment regarding the amount of shoes patient's son has and patient reported the comment triggered thoughts regarding patient's experience of having one pair of shoes as a child. Patient reported feeling patient's father does not want a relationship with patient and reported patient's father has a relationship with patient's step sibling. Patient stated, because of how angry I am, my dad couldn't love me. Patient stated, I'm jealous of people who have a mom that they're close to. Patient stated, I don't think having a dad is important because I didn't have a dad and stated, I know that my feelings are misguided. Patient stated, I never understood why my parents didn't like me and stated, I think I probably have a very negative view of myself.   Interventions: Cognitive Behavioral Therapy. Clinician conducted session via caregility video from clinician's home office. Patient provided verbal consent to proceed with telehealth session and is aware of limitations of telephone or video visits. Patient participated in session from patient's home.  Reviewed events since last session and assessed for changes. Clarified patient's statement at the beginning of session and explored positive and negative events that have occurred since last session. Discussed triggers for depressed mood. Processed thoughts and feelings triggered by family dynamics  as it relates to relationship with significant other's  daughter and significant other's daughter spending time with patient's son. Explored and identified triggers for anger and underlying emotions.  Processed patient's feelings regarding relationship with patient's father and mother. Clinician challenged thoughts/statements to assist patient in reframing thoughts/situations. Explored the impact of patient's past on patient's perception of situations. Provided psycho education related core beliefs. Clinician requested for homework patient complete a positive qualities survey and continue thought record. Discussed clinician's vacation and reviewed resources for patient to utilize in the event patient experiences a mental health emergency, such as, calling 911, calling [phone], going to local emergency room or [facility] 24 hour behavioral health urgent care.     Collaboration of Care: Other not required at this time   Diagnosis:  Generalized anxiety disorder   Specific phobia   Major depressive disorder, recurrent, in partial remission (HCC)     Plan: Patient is to utilize [employer] [facility], thought re-framing, relaxation techniques, behavioral activation, and coping strategies to decrease symptoms associated with their diagnosis. Frequency: bi-weekly  Modality: individual      Long-term goal:   Reduce overall level, frequency, and intensity of the feelings of depression and anxiety as evidenced by decreased sadness, lack of energy, loss of interest, difficulty staying asleep, feeling overwhelmed and anxious, fear of contacting an illness, panic attacks,  lack of appetite, muscle tension, restlessness, difficulty concentrating, worry from 7 days/week to 0 to 1 days/week per patient report for at least 3 consecutive months. Target Date: 12/05/24  Progress: progressing    Short-term goal:  Decrease symptoms of anxiety when changes occur in patient's plans/schedule per patient's report Target Date: 06/06/24  Progress: progressing    Decrease symptoms  of anxiety and feelings of fear related to germs in public settings per patient's report Target Date: 06/06/24  Progress: progressing    Increase patient's participation in activities outside of the home patient enjoys from 1-2 times per month to 4 times per month  Target Date: 06/06/24  Progress: progressing    Identify, challenge, and replace negative thought patterns/cognitive distortions and negative self talk that contribute to feelings of anxiety and depression with positive thoughts, beliefs, and positive self talk per patient's report Target Date: 06/06/24  Progress: progressing    Decrease obsessive thoughts and compulsions to clean areas of patient's home prior to participation in other activities per patient's report Target Date: 06/06/24  Progress: progressing    Decrease negative thoughts and feelings of resentment towards patient's significant other and significant other's previous partner Target Date: 06/06/24  Progress: progressing           Darice Seats, LCSW

## 2024-03-17 NOTE — Assessment & Plan Note (Signed)
Continue management with OB/GYN.

## 2024-03-17 NOTE — Assessment & Plan Note (Signed)
 GAD positive, she is not interested in medications at this time Screen TSH and labs

## 2024-03-24 ENCOUNTER — Ambulatory Visit: Admitting: Clinical

## 2024-03-24 DIAGNOSIS — F3341 Major depressive disorder, recurrent, in partial remission: Secondary | ICD-10-CM

## 2024-03-24 DIAGNOSIS — F411 Generalized anxiety disorder: Secondary | ICD-10-CM | POA: Diagnosis not present

## 2024-03-24 DIAGNOSIS — F40298 Other specified phobia: Secondary | ICD-10-CM | POA: Diagnosis not present

## 2024-03-24 NOTE — Progress Notes (Signed)
   Monique Barthel, LCSW

## 2024-03-24 NOTE — Progress Notes (Signed)
 [facility] Counselor/Therapist Progress Note  Patient ID: Monique Barnett, MRN: [id number],    Date: 03/24/2024  Time Spent: 9:35am - 10:38am : 63 minutes  Treatment Type: Individual Therapy  Reported Symptoms: Patient reported recent feelings of anxiety and anger  Mental Status Exam: Appearance:  Neat and Well Groomed     Behavior: Appropriate  Motor: Normal  Speech/Language:  Clear and Coherent and Normal Rate  Affect: Appropriate  Mood: normal  Thought process: normal  Thought content:   WNL  Sensory/Perceptual disturbances:   WNL  Orientation: oriented to person, place, time/date, and situation  Attention: Good  Concentration: Good  Memory: WNL  Fund of knowledge:  Good  Insight:   Good  Judgment:  Good  Impulse Control: Good   Risk Assessment: Danger to Self:  No Patient denied current suicidal ideation  Self-injurious Behavior: No Danger to Others: No Patient denied current homicidal ideation Duty to Warn:no Physical Aggression / Violence:No  Access to Firearms a concern: No  Gang Involvement:No   Subjective: Patient stated, I would give it a 5 out of 10, there were some good, some bad in response to events since last session. Patient stated, Its been good but when its bad its been really bad in response to mood since last session. Patient reported crying and feel;ng angry when mood has declined. Patient stated, its ok in response to current mood. Patient reported patient's mother was in the hospital recently due to high blood pressure and patient reported feeling scared regarding mother's health. Patient stated, if my mom and grandma died I don't have anybody else and patient reported changes in patient's behaviors in response to recent changes in mother's health. Patient reported experiencing feelings of anxiety in response to mother's hospitalization. Patient reported feeling angry and feeling responsible for planning significant other's  daughter's birthday party. Patient reported feelings of anger in response to significant other's previous partner's recent behaviors and reported feeling anger towards significant other. Patient stated, I was just kind of mad at all that. Patient stated, Its not her, it's her mom, I know in reference to patient's feelings regarding significant other's daughter. Patient reported experiencing anxiety when around significant other's daughter. Patient stated, my dad left my family for another family and reported fear that significant other will resume relationship with significant other's previous partner. Patient stated, I know its a fear based in nothing in reference to fear of significant other resuming relationship with previous partner. Patient reported fear and thought that something bad will happen. Patient reported always waiting for the other shoe to drop.  Patient reported during a recent interaction with a parent while at a community park patient became upset in response to a child's behaviors and walked away while the parent was talking to patient about the incident.   Interventions: Cognitive Behavioral Therapy. Clinician conducted session via caregility video from clinician's home office. Patient provided verbal consent to proceed with telehealth session and is aware of limitations of telephone or video visits. Patient participated in session from patient's home.  Reviewed events since last session and assessed for changes. Provided supportive therapy, active listening, and validation as patient discussed recent hospitalization/changes in mother's health and patient's response. Reviewed patient's thought record. Processed patient's thoughts/feelings related to significant other's daughter's birthday and relationship with significant other's daughter. Provided psycho education related to boundaries and enabling behaviors. Provided psycho education related to cognitive distortions, such as,  assumptions, over generalization. Challenged thoughts/statements to assist patient in reframing thoughts. Processed patient's  feelings related to childhood experiences and the impact on patient's beliefs. Reflected back to patient observations in patient's thoughts patterns and behavioral responses. Processed patein's recent interaction with another parent at an arcade and examined patient's behavioral responses. Clinician will review patient's homework to complete a positive qualities survey next session and requested patient continue thought record for homework and practice identifying evidence for/against cognitive distortions.     Collaboration of Care: Other not required at this time   Diagnosis:  Generalized anxiety disorder   Specific phobia   Major depressive disorder, recurrent, in partial remission (HCC)     Plan: Patient is to utilize [employer] [facility], thought re-framing, relaxation techniques, behavioral activation, and coping strategies to decrease symptoms associated with their diagnosis. Frequency: bi-weekly  Modality: individual      Long-term goal:   Reduce overall level, frequency, and intensity of the feelings of depression and anxiety as evidenced by decreased sadness, lack of energy, loss of interest, difficulty staying asleep, feeling overwhelmed and anxious, fear of contacting an illness, panic attacks,  lack of appetite, muscle tension, restlessness, difficulty concentrating, worry from 7 days/week to 0 to 1 days/week per patient report for at least 3 consecutive months. Target Date: 12/05/24  Progress: progressing    Short-term goal:  Decrease symptoms of anxiety when changes occur in patient's plans/schedule per patient's report Target Date: 06/06/24  Progress: progressing    Decrease symptoms of anxiety and feelings of fear related to germs in public settings per patient's report Target Date: 06/06/24  Progress: progressing    Increase patient's  participation in activities outside of the home patient enjoys from 1-2 times per month to 4 times per month  Target Date: 06/06/24  Progress: progressing    Identify, challenge, and replace negative thought patterns/cognitive distortions and negative self talk that contribute to feelings of anxiety and depression with positive thoughts, beliefs, and positive self talk per patient's report Target Date: 06/06/24  Progress: progressing    Decrease obsessive thoughts and compulsions to clean areas of patient's home prior to participation in other activities per patient's report Target Date: 06/06/24  Progress: progressing    Decrease negative thoughts and feelings of resentment towards patient's significant other and significant other's previous partner Target Date: 06/06/24  Progress: progressing         Darice Seats, LCSW

## 2024-03-27 ENCOUNTER — Ambulatory Visit

## 2024-03-31 ENCOUNTER — Ambulatory Visit (INDEPENDENT_AMBULATORY_CARE_PROVIDER_SITE_OTHER): Admitting: Clinical

## 2024-03-31 DIAGNOSIS — F40298 Other specified phobia: Secondary | ICD-10-CM

## 2024-03-31 DIAGNOSIS — F3341 Major depressive disorder, recurrent, in partial remission: Secondary | ICD-10-CM | POA: Diagnosis not present

## 2024-03-31 DIAGNOSIS — F411 Generalized anxiety disorder: Secondary | ICD-10-CM | POA: Diagnosis not present
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
## 2024-03-31 NOTE — Progress Notes (Signed)
   Monique Barthel, LCSW

## 2024-04-07 ENCOUNTER — Ambulatory Visit (INDEPENDENT_AMBULATORY_CARE_PROVIDER_SITE_OTHER): Admitting: Clinical

## 2024-04-07 DIAGNOSIS — F40298 Other specified phobia: Secondary | ICD-10-CM | POA: Diagnosis not present

## 2024-04-07 DIAGNOSIS — F3341 Major depressive disorder, recurrent, in partial remission: Secondary | ICD-10-CM | POA: Diagnosis not present

## 2024-04-07 DIAGNOSIS — F411 Generalized anxiety disorder: Secondary | ICD-10-CM | POA: Diagnosis not present
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
## 2024-04-07 NOTE — Progress Notes (Signed)
   Doree Barthel, LCSW

## 2024-04-11 ENCOUNTER — Encounter: Payer: Self-pay | Admitting: Internal Medicine

## 2024-04-11 ENCOUNTER — Other Ambulatory Visit (HOSPITAL_COMMUNITY)
Admission: RE | Admit: 2024-04-11 | Discharge: 2024-04-11 | Disposition: A | Discharge status: Home / Self Care | Source: Ambulatory Visit | Attending: Internal Medicine | Admitting: Internal Medicine

## 2024-04-11 ENCOUNTER — Other Ambulatory Visit: Payer: Self-pay

## 2024-04-11 ENCOUNTER — Ambulatory Visit: Admitting: Internal Medicine

## 2024-04-11 VITALS — BP 112/76 | HR 76 | Temp 98.3°F | Resp 18 | Ht 65.0 in | Wt 160.7 lb

## 2024-04-11 DIAGNOSIS — B372 Candidiasis of skin and nail: Secondary | ICD-10-CM | POA: Diagnosis not present

## 2024-04-11 DIAGNOSIS — N898 Other specified noninflammatory disorders of vagina: Secondary | ICD-10-CM

## 2024-04-11 MED ORDER — KETOCONAZOLE 2 % EX CREA: 1.0000 | TOPICAL_CREAM | Freq: Every day | CUTANEOUS | 0 refills | Status: AC

## 2024-04-11 NOTE — Progress Notes (Signed)
 Acute Office Visit  Subjective:     Patient ID: Monique Barnett, female    DOB: 08/15/1996, 28 y.o.   MRN: [id number]  Chief Complaint  Patient presents with   Ear Pain    Follow up from [facility]    HPI Patient is in today for recheck on ears. This is my first time meeting her.   Discussed the use of AI scribe software for clinical note transcription with the patient, who gave verbal consent to proceed.  History of Present Illness Monique Barnett is a 28 year old female who presents with a rash and diarrhea following antibiotic treatment for swimmer's ear.  She developed a rash and diarrhea after completing a 10-day course of cefdinir on April 05, 2024. The rash persists, primarily affecting the vulva and slightly extending to the buttocks, causing itching and irritation. It is described as peeling and itchy, with micro cuts causing bright red bleeding. There is no burning upon urination.  Diarrhea coincided with the antibiotic course and her menstrual period, resulting in frequent watery stools and irritation in the affected area. She used Preparation H for itching but is concerned about prolonged use due to its steroid content.  She has a documented penicillin allergy, and cefdinir, a cephalosporin, was prescribed despite this. There is a family history of medication sensitivities, with her mother and sister also experiencing adverse reactions. Her skin is very sensitive, particularly in the affected area.  Her ear symptoms have improved, with only occasional slight pain, and the severe pain that prompted her initial urgent care visit has resolved. She completed the antibiotic course, and her ear symptoms have largely resolved.    Review of Systems  Constitutional:  Negative for chills and fever.  HENT:  Negative for ear pain.   Skin:  Positive for itching and rash.        Objective:    BP 112/76   Pulse 76   Temp 98.3 F (36.8 C) (Oral)   Resp 18   Ht 5' 5 (1.651 m)   Wt  160 lb 11.2 oz (72.9 kg)   SpO2 99%   BMI 26.74 kg/m  BP Readings from Last 3 Encounters:  04/11/24 112/76  03/06/24 116/68  01/06/24 107/67   Wt Readings from Last 3 Encounters:  04/11/24 160 lb 11.2 oz (72.9 kg)  03/06/24 163 lb (73.9 kg)  01/06/24 167 lb (75.8 kg)      Physical Exam Constitutional:      Appearance: Normal appearance.  HENT:     Head: Normocephalic and atraumatic.     Right Ear: Tympanic membrane, ear canal and external ear normal.     Left Ear: Tympanic membrane, ear canal and external ear normal.  Eyes:     Conjunctiva/sclera: Conjunctivae normal.  Cardiovascular:     Rate and Rhythm: Normal rate and regular rhythm.  Pulmonary:     Effort: Pulmonary effort is normal.     Breath sounds: Normal breath sounds.  Skin:    General: Skin is warm and dry.     Findings: Rash present.     Comments: Yeast rash on the bilateral groin   Neurological:     General: No focal deficit present.     Mental Status: She is alert. Mental status is at baseline.  Psychiatric:        Mood and Affect: Mood normal.        Behavior: Behavior normal.     No results found for any visits on 04/11/24.  Assessment & Plan:   Assessment & Plan Irritant contact dermatitis and pruritus of vulva and perianal area Symptoms likely due to diarrhea and antibiotic use, consistent with a yeast rash. Condition improving but remains itchy. - Prescribe medicated yeast cream for external use. - Instruct to apply cream to groin and perianal area, avoiding internal application.  Diarrhea due to antibiotics Diarrhea from cefdinir use, worsened by menstruation, causing skin irritation. - Recommend starting probiotic such as Align. - Advise taking probiotics with future antibiotics.  History of bilateral otitis media Previous symptoms resolved, no current infection. - No further treatment required.  Possible cephalosporin allergy Rash and diarrhea with cefdinir, potential  cross-reactivity with penicillin allergy. Reaction less severe than penicillin allergy. - Document reaction to cefdinir as possible low-severity allergy. - Advise caution with future cephalosporin use, consider alternatives.  - ketoconazole  (NIZORAL ) 2 % cream; Apply 1 Application topically daily for 14 days.  Dispense: 14 g; Refill: 0 - Cervicovaginal ancillary only - ketoconazole  (NIZORAL ) 2 % cream; Apply 1 Application topically daily for 14 days.  Dispense: 14 g; Refill: 0   Return if symptoms worsen or fail to improve.  Sharyle Fischer, DO

## 2024-04-12 ENCOUNTER — Telehealth: Payer: Self-pay

## 2024-04-12 ENCOUNTER — Ambulatory Visit: Payer: Self-pay | Admitting: Internal Medicine

## 2024-04-12 DIAGNOSIS — B379 Candidiasis, unspecified: Secondary | ICD-10-CM

## 2024-04-12 DIAGNOSIS — B9689 Other specified bacterial agents as the cause of diseases classified elsewhere: Secondary | ICD-10-CM

## 2024-04-12 LAB — CERVICOVAGINAL ANCILLARY ONLY
Bacterial Vaginitis (gardnerella): POSITIVE — AB
Candida Glabrata: NEGATIVE
Candida Vaginitis: POSITIVE — AB
Comment: NEGATIVE
Comment: NEGATIVE
Comment: NEGATIVE

## 2024-04-12 MED ORDER — FLUCONAZOLE 150 MG PO TABS: 150.0000 mg | ORAL_TABLET | Freq: Once | ORAL | 0 refills | Status: AC

## 2024-04-12 MED ORDER — METRONIDAZOLE 500 MG PO TABS: 500.0000 mg | ORAL_TABLET | Freq: Two times a day (BID) | ORAL | 0 refills | Status: AC

## 2024-04-12 NOTE — Telephone Encounter (Signed)
 Copied from CRM #[id number]. Topic: Clinical - Medication Question >> Apr 12, 2024  1:00 PM Jayma L wrote: Reason for CRM: patient called in and wanted to know if the medicines she was prescribed yesterday if she can take them together, metroNIDAZOLE  (FLAGYL ) 500 MG tablet , and fluconazole  (DIFLUCAN ) 150 MG tablet . Please call her back to let her know how to use these - found on google it says not to take at same time >> Apr 12, 2024  1:53 PM RMA Sherrilyn PARAS wrote: Sent patient a message that the medication can be taken at the same time

## 2024-04-12 NOTE — Telephone Encounter (Signed)
 Called patient and informed

## 2024-04-21 ENCOUNTER — Ambulatory Visit: Admitting: Clinical

## 2024-04-21 DIAGNOSIS — F411 Generalized anxiety disorder: Secondary | ICD-10-CM

## 2024-04-21 DIAGNOSIS — F3341 Major depressive disorder, recurrent, in partial remission: Secondary | ICD-10-CM

## 2024-04-21 DIAGNOSIS — F40298 Other specified phobia: Secondary | ICD-10-CM

## 2024-04-21 NOTE — Progress Notes (Signed)
 [facility] Counselor/Therapist Progress Note  Patient ID: Monique Barnett, MRN: [id number],    Date: 04/21/2024  Time Spent: 10:36am - 11:34am : 58 minutes   Treatment Type: Individual Therapy  Reported Symptoms: recent feelings of anxiety  Mental Status Exam: Appearance:  Neat and Well Groomed     Behavior: Appropriate  Motor: Normal  Speech/Language:  Clear and Coherent and Normal Rate  Affect: Appropriate  Mood: normal  Thought process: normal  Thought content:   WNL  Sensory/Perceptual disturbances:   WNL  Orientation: oriented to person, place, time/date, and situation  Attention: Good  Concentration: Good  Memory: WNL  Fund of knowledge:  Good  Insight:   Good  Judgment:  Good  Impulse Control: Good   Risk Assessment: Danger to Self:  No Patient denied current suicidal ideation  Self-injurious Behavior: No Danger to Others: No Patient denied current homicidal ideation Duty to Warn:no Physical Aggression / Violence:No  Access to Firearms a concern: No  Gang Involvement:No   Subjective: Patient stated, I gave my past two weeks a 3.5 out of 10 and stated, I was very ill in response to events since last session. Patient stated, I was very anxious because I had to take medicine for almost 3 weeks, that was a big part of my anxiety. Patient stated, I was very sad because it was very painful in reference to recent infection. Patient reported a recent appointment with a new PCP and reported patient has reconsidered a consultation with a psychiatrist. Patient stated,  I feel like I did a lot of negative digging, Ive tried to balance that out by doing some positives. Patient reported patient has been searching for jobs and reported feeling nervous in response to job search. Patient reported patient has started inquiring about classes patient would like to take, such as, adult tap/ballet, acting. Patient stated, I feel like I missed out on some  creative things as a kid and has been inquiring about classes as a result. Patient reported an opening for preschool became available for patient's son and patient declined the opening due to fear of son getting ill. Patient stated,  my anxiety gets the best of me. Patient stated, I don't care what happened, I care how I reacted in reference to recent interactions with others. Patient reported patient has been establishing boundaries, saying no, and stated, It has been hard. Patient stated, I'm happier because I don't feel responsible for something that's not mine to do in response to establishing boundaries. Patient reported patient watched a documentary regarding trauma and reported the documentary impacted patient and promoted patient to reflect on patient's experiences. Patient stated,  It was important for me to hear that you can get use to things that hurt and inspire you in reference to recent documentary. Patient stated, It might be a good thing that all of this happened to me, I just don't want to be this person anymore. Patient stated, I do avoid talking to other people about traumatic events. Patient stated, I feel like I do that with everything in reference to avoidance. Patient stated, If I avoid the world the world can't hurt me or give me a bad memory. Patient stated, I can do those in response to opposite action and identifying one positive for each negative thought. Patient stated, I feel good today.   Interventions: Cognitive Behavioral Therapy and DBT skills. Clinician conducted session via caregility video from clinician's home office. Patient provided verbal consent to proceed with telehealth session  and is aware of limitations of telephone or video visits. Patient participated in session from patient's home.  Patient's son was present during today's session and patient provided verbal consent for son to be present during session. Reviewed events since last session and  assessed for changes. Discussed recent triggers for anxiety and patient's response to triggers. Discussed referral to a psychiatrist and therapist. Reviewed challenging thoughts/decatastrophizing. Discussed changes in patient's interactions and responses to others, such as, establishing boundaries and the impact on patient's mood. Processed patient's thoughts/feelings regarding recent documentary. Provided psycho education related to trauma and treatment of trauma. Discussed patient's previous experiences related to avoidance. Provided psycho education related to cycle of avoidance and opposite action. Clinician requested patient continue thought record for homework and practice identifying evidence for/against cognitive distortions.   Collaboration of Care: not required at this time   Diagnosis:  Generalized anxiety disorder   Specific phobia   Major depressive disorder, recurrent, in partial remission (HCC)     Plan: Patient is to utilize [employer] [facility], thought re-framing, relaxation techniques, behavioral activation, and coping strategies to decrease symptoms associated with their diagnosis. Frequency: bi-weekly  Modality: individual      Long-term goal:   Reduce overall level, frequency, and intensity of the feelings of depression and anxiety as evidenced by decreased sadness, lack of energy, loss of interest, difficulty staying asleep, feeling overwhelmed and anxious, fear of contacting an illness, panic attacks,  lack of appetite, muscle tension, restlessness, difficulty concentrating, worry from 7 days/week to 0 to 1 days/week per patient report for at least 3 consecutive months. Target Date: 12/05/24  Progress: progressing    Short-term goal:  Decrease symptoms of anxiety when changes occur in patient's plans/schedule per patient's report Target Date: 06/06/24  Progress: progressing    Decrease symptoms of anxiety and feelings of fear related to germs in public settings  per patient's report Target Date: 06/06/24  Progress: progressing    Increase patient's participation in activities outside of the home patient enjoys from 1-2 times per month to 4 times per month  Target Date: 06/06/24  Progress: progressing    Identify, challenge, and replace negative thought patterns/cognitive distortions and negative self talk that contribute to feelings of anxiety and depression with positive thoughts, beliefs, and positive self talk per patient's report Target Date: 06/06/24  Progress: progressing    Decrease obsessive thoughts and compulsions to clean areas of patient's home prior to participation in other activities per patient's report Target Date: 06/06/24  Progress: progressing    Decrease negative thoughts and feelings of resentment towards patient's significant other and significant other's previous partner Target Date: 06/06/24  Progress: progressing     Darice Seats, LCSW

## 2024-04-21 NOTE — Progress Notes (Signed)
   Darice Seats, LCSW

## 2024-04-28 ENCOUNTER — Ambulatory Visit: Admitting: Internal Medicine

## 2024-04-28 ENCOUNTER — Ambulatory Visit: Admitting: Clinical

## 2024-04-28 DIAGNOSIS — F411 Generalized anxiety disorder: Secondary | ICD-10-CM

## 2024-04-28 DIAGNOSIS — F40298 Other specified phobia: Secondary | ICD-10-CM | POA: Diagnosis not present

## 2024-04-28 DIAGNOSIS — F3341 Major depressive disorder, recurrent, in partial remission: Secondary | ICD-10-CM | POA: Diagnosis not present

## 2024-04-28 NOTE — Progress Notes (Signed)
   Darice Seats, LCSW

## 2024-04-28 NOTE — Progress Notes (Signed)
 [facility] Counselor/Therapist Progress Note  Patient ID: Monique Barnett, MRN: [id number],    Date: 04/28/2024  Time Spent: 3:25pm - 4:29pm : 64 minutes  Treatment Type: Individual Therapy  Reported Symptoms: Patient reported recent feelings of anxiety and depressed mood  Mental Status Exam: Appearance:  Neat and Well Groomed     Behavior: Appropriate  Motor: Normal  Speech/Language:  Clear and Coherent and Normal Rate  Affect: Appropriate  Mood: normal  Thought process: normal  Thought content:   WNL  Sensory/Perceptual disturbances:   WNL  Orientation: oriented to person, place, time/date, and situation  Attention: Good  Concentration: Good  Memory: WNL  Fund of knowledge:  Good  Insight:   Good  Judgment:  Good  Impulse Control: Good   Risk Assessment: Danger to Self:  No Patient denied current suicidal ideation  Self-injurious Behavior: No Danger to Others: No Patient denied current homicidal ideation Duty to Warn:no Physical Aggression / Violence:No  Access to Firearms a concern: No  Gang Involvement:No   Subjective: Patient stated, a lot of anxiety, I would give it a 1 or 2 anxiety level in response to events since last session. Patient reported significant other asked patient to help significant other's daughter prepare for a competition and attend competition. Patient reported significant other's daughter visited patient over the weekend and patient reported I wanted to provide some normalcy.  Patient reported significant other is not comfortable with daughter's mother's parenting and is considering obtaining full custody. Patient stated, my anxiety is he's (significant other) going to go for custody, I feel a lot of anxiety. Patient stated, my worry is that he's going to get full custody and stated, I'm going to have another kid in my house to take care of. Patient stated, It feels very up in the air right now in reference to custody of  significant other's daughter. Patient stated, I have a lot of resentment towards her mother. Patient stated, I can tell she (significant other's daughter) really likes me and reported significant other's daughter initiated a conversation with patient to discuss her feelings. Patient stated, I think we've maybe had a misunderstanding in reference to significant other's daughter. Patient stated, I just kind of started thinking I don't think I've been as nice as I could've been, its kind of regret seeping in and its kind of depressing in reference to relationship with significant other's daughter. Patient stated, I think if it happens once it's going to happen again and again in reference to negative responses. Patient stated, I feel like that all or nothing person, its either this or that. Patient reported patient previously spent a lot of time with significant other's daughter when daughter was younger. Patient stated, I feel anxious because I'm only going to lose her again. Patient reported patient practiced opposite action a little bit since last session and reported patient practiced opposite action today when going to park. Patient stated, it did work this morning, I think that good thought thing helps me a little bit in reference to opposite action. Patient stated, Its good, I had a good day in response to current mood.  Interventions: Cognitive Behavioral Therapy. Clinician conducted session via caregility video from clinician's home office. Patient provided verbal consent to proceed with telehealth session and is aware of limitations of telephone or video visits. Patient participated in session from patient's home.  Reviewed events since last session and assessed for changes. Discussed recent triggers for anxiety. Explored and identified thoughts triggered by situation  with significant other's daughter. Discussed patient's relationship with significant other's daughter and recent  interaction. Provided psycho education related to projection and defense mechanisms. Reflected back to patient patterns in patient's thoughts/responses. Challenged statements/thoughts to assist patient in reframing thoughts. Provided psycho education related to unhelpful thinking patterns/cognitive distortions, such as, over generalization, all or nothing. Provided psycho education related to generating alternatives and assisted patient in generating alternatives as it relates to significant other's daughter's behaviors. Reviewed opposite action and patient's implementation of opposite action. Discussed referral to psychiatrist. Clinician requested patient continue thought record for homework and practice identifying evidence for/against cognitive distortions, practice opposite action.    Collaboration of Care: Discussed consent required for referral to [facility]   Diagnosis:  Generalized anxiety disorder   Specific phobia   Major depressive disorder, recurrent, in partial remission (HCC)     Plan: Patient is to utilize [employer] Therapy, thought re-framing, relaxation techniques, behavioral activation, and coping strategies to decrease symptoms associated with their diagnosis. Frequency: bi-weekly  Modality: individual      Long-term goal:   Reduce overall level, frequency, and intensity of the feelings of depression and anxiety as evidenced by decreased sadness, lack of energy, loss of interest, difficulty staying asleep, feeling overwhelmed and anxious, fear of contacting an illness, panic attacks,  lack of appetite, muscle tension, restlessness, difficulty concentrating, worry from 7 days/week to 0 to 1 days/week per patient report for at least 3 consecutive months. Target Date: 12/05/24  Progress: progressing    Short-term goal:  Decrease symptoms of anxiety when changes occur in patient's plans/schedule per patient's report Target Date: 06/06/24  Progress:  progressing    Decrease symptoms of anxiety and feelings of fear related to germs in public settings per patient's report Target Date: 06/06/24  Progress: progressing    Increase patient's participation in activities outside of the home patient enjoys from 1-2 times per month to 4 times per month  Target Date: 06/06/24  Progress: progressing    Identify, challenge, and replace negative thought patterns/cognitive distortions and negative self talk that contribute to feelings of anxiety and depression with positive thoughts, beliefs, and positive self talk per patient's report Target Date: 06/06/24  Progress: progressing    Decrease obsessive thoughts and compulsions to clean areas of patient's home prior to participation in other activities per patient's report Target Date: 06/06/24  Progress: progressing    Decrease negative thoughts and feelings of resentment towards patient's significant other and significant other's previous partner Target Date: 06/06/24  Progress: progressing       Monique Seats, LCSW

## 2024-05-05 ENCOUNTER — Ambulatory Visit: Admitting: Clinical

## 2024-05-05 DIAGNOSIS — F3341 Major depressive disorder, recurrent, in partial remission: Secondary | ICD-10-CM | POA: Diagnosis not present

## 2024-05-05 DIAGNOSIS — F411 Generalized anxiety disorder: Secondary | ICD-10-CM

## 2024-05-05 DIAGNOSIS — F40298 Other specified phobia: Secondary | ICD-10-CM | POA: Diagnosis not present

## 2024-05-05 NOTE — Progress Notes (Signed)
   Darice Seats, LCSW

## 2024-05-05 NOTE — Progress Notes (Signed)
 [facility] Counselor/Therapist Progress Note  Patient ID: Monique Barnett, MRN: [id number],    Date: 05/05/2024  Time Spent: 10:34am - 11:33am : 59 minutes  Treatment Type: Individual Therapy  Reported Symptoms: anxiety, hyperventilating last night per patient  Mental Status Exam: Appearance:  Neat and Well Groomed     Behavior: Appropriate  Motor: Normal  Speech/Language:  Clear and Coherent and Normal Rate  Affect: Appropriate  Mood: anxious  Thought process: normal  Thought content:   WNL  Sensory/Perceptual disturbances:   WNL  Orientation: oriented to person, place, time/date, and situation  Attention: Good  Concentration: Good  Memory: WNL  Fund of knowledge:  Good  Insight:   Good  Judgment:  Good  Impulse Control: Good   Risk Assessment: Danger to Self:  No Patient denied current suicidal ideation  Self-injurious Behavior: No Danger to Others: No Patient denied current homicidal ideation Duty to Warn:no Physical Aggression / Violence:No  Access to Firearms a concern: No  Gang Involvement:No   Subjective: Patient stated, its been fine in response to events since last session. Patient reported I do have a little anxiety and reported anxiety due to discussions with physician regarding the possibility of having an ablation. Patient stated, that is something that is giving me a little anxiety in reference to medical procedure and patient reported an appointment with provider on Monday to discuss procedure. Patient stated, I'm very anxious recently and reported increase anxiety due to frequency of patient's menstrual cycle. Patient stated, its been impacting my sleep too, I've had nightmares.  Patient stated, it does make me sad, it does make life a little harder when you don't feel good all the time in reference to health concerns. Patient stated, I hyperventilate sometimes and reported breathing into a paper bag when hyperventilating last  night. Patient stated, I do like that one in response to imagery and stated,  ill try that next time.  Patient reported patient feels situations that pertain to others trigger anxiety for patient. Patient stated, its a little stressful, its a little relieving in reference to upcoming move. Patient reported patient feels stress associated with upcoming move. Patient reported patient has been trying to identify and focus on the positive aspects of fostering a relationship with significant other's daughter. Patient stated, my ultimate fear is nobody loves me enough, nobody likes me enough. Patient stated, I do think its genuinely impacted how I go about other people and how I go about myself in reference to therapy homework. Patient stated, I feel better than last night.   Interventions: Cognitive Behavioral Therapy. Clinician conducted session via caregility video from clinician's home office. Patient provided verbal consent to proceed with telehealth session and is aware of limitations of telephone or video visits. Patient participated in session from patient's home.  Reviewed events since last session and assessed for changes. Provided supportive therapy and active listening as patient discussed medical concerns and impact on patient's mood. Explored and identified recent triggers for anxiety and increased stress. Provided psycho education related to symptoms of anxiety/panic. Provided psycho education related to deep breathing exercises and imagery exercise. Reviewed challenging/decatastrophizing negative thought patterns/distortions related to situations pertaining to others. Provided psycho education related to cognitive distortions, such as, over generalization. Processed changes in patient's perspective as it relates to relationship with significant other's daughter. Clinician requested patient continue thought record for homework and practice identifying evidence for/against cognitive  distortions, practice opposite action.    Collaboration of Care: not required at this  time   Diagnosis:  Generalized anxiety disorder   Specific phobia   Major depressive disorder, recurrent, in partial remission (HCC)     Plan: Patient is to utilize [employer] Therapy, thought re-framing, relaxation techniques, behavioral activation, and coping strategies to decrease symptoms associated with their diagnosis. Frequency: bi-weekly  Modality: individual      Long-term goal:   Reduce overall level, frequency, and intensity of the feelings of depression and anxiety as evidenced by decreased sadness, lack of energy, loss of interest, difficulty staying asleep, feeling overwhelmed and anxious, fear of contacting an illness, panic attacks,  lack of appetite, muscle tension, restlessness, difficulty concentrating, worry from 7 days/week to 0 to 1 days/week per patient report for at least 3 consecutive months. Target Date: 12/05/24  Progress: progressing    Short-term goal:  Decrease symptoms of anxiety when changes occur in patient's plans/schedule per patient's report Target Date: 06/06/24  Progress: progressing    Decrease symptoms of anxiety and feelings of fear related to germs in public settings per patient's report Target Date: 06/06/24  Progress: progressing    Increase patient's participation in activities outside of the home patient enjoys from 1-2 times per month to 4 times per month  Target Date: 06/06/24  Progress: progressing    Identify, challenge, and replace negative thought patterns/cognitive distortions and negative self talk that contribute to feelings of anxiety and depression with positive thoughts, beliefs, and positive self talk per patient's report Target Date: 06/06/24  Progress: progressing    Decrease obsessive thoughts and compulsions to clean areas of patient's home prior to participation in other activities per patient's report Target Date: 06/06/24   Progress: progressing    Decrease negative thoughts and feelings of resentment towards patient's significant other and significant other's previous partner Target Date: 06/06/24  Progress: progressing     Darice Seats, LCSW

## 2024-05-08 ENCOUNTER — Ambulatory Visit: Admitting: Family Medicine

## 2024-05-11 ENCOUNTER — Ambulatory Visit (INDEPENDENT_AMBULATORY_CARE_PROVIDER_SITE_OTHER): Admitting: Nurse Practitioner

## 2024-05-11 ENCOUNTER — Encounter: Payer: Self-pay | Admitting: Nurse Practitioner

## 2024-05-11 VITALS — BP 108/70 | HR 97 | Resp 16 | Ht 65.0 in | Wt 159.9 lb

## 2024-05-11 DIAGNOSIS — M7072 Other bursitis of hip, left hip: Secondary | ICD-10-CM | POA: Diagnosis not present

## 2024-05-11 DIAGNOSIS — H9201 Otalgia, right ear: Secondary | ICD-10-CM

## 2024-05-11 NOTE — Progress Notes (Signed)
 BP 108/70   Pulse 97   Resp 16   Ht 5' 5 (1.651 m)   Wt 159 lb 14.4 oz (72.5 kg)   LMP 04/20/2024 (Exact Date)   SpO2 99%   BMI 26.61 kg/m    Subjective:    Patient ID: Monique Barnett, female    DOB: 13-Aug-1996, 28 y.o.   MRN: [id number]  HPI: Monique Barnett is a 28 y.o. female presenting today with a right ear stinging sensation. Patient was seen 03/26/24 at the urgent care and diagnosed with bilateral otitis media. Patient was treated with antibiotics and symptoms improved, however she reports intermittent right ear stinging  for several days. She denies any ear discharge. She does not report pain in the left ear. She denies any fever, cough, runny nose, or headache. Patient also endorses intermittent left knee pain that starts at her left hip and shoots down her leg. She reports this has been going on for a couple of months now and rates the pain a 3-4/10 on the pain scale. She reports the pain is dull/achy. She notices that the pain usually appears 1 week before her period begins and is worse when she lays down on that hip. She also reports recent piliates classes that she feels like may have exacerbated the pain in her hip. She reports normal gait, but states when it gets to be one week before her period she feels like it is more difficult to exercise due to the pain.        05/11/2024    9:25 AM 03/06/2024    2:23 PM  Depression screen PHQ 2/9  Decreased Interest 0 0  Down, Depressed, Hopeless 0 0  PHQ - 2 Score 0 0  Altered sleeping  0  Tired, decreased energy  1  Change in appetite  1  Feeling bad or failure about yourself   0  Trouble concentrating  0  Moving slowly or fidgety/restless  0  Suicidal thoughts  0  PHQ-9 Score  2    Relevant past medical, surgical, family and social history reviewed and updated as indicated. Interim medical history since our last visit reviewed. Allergies and medications reviewed and updated.  Review of Systems  Constitutional:  Negative for fever or weight change.  Respiratory: Negative for cough and shortness of breath.   Cardiovascular: Negative for chest pain or palpitations.  Gastrointestinal: Negative for abdominal pain, no bowel changes.  Musculoskeletal: Positive for Left hip and knee pain. Negative for gait problem or joint swelling.  Skin: Negative for rash.  Neurological: Negative for dizziness or headache.  No other specific complaints in a complete review of systems (except as listed in HPI above).      Objective:     BP 108/70   Pulse 97   Resp 16   Ht 5' 5 (1.651 m)   Wt 159 lb 14.4 oz (72.5 kg)   LMP 04/20/2024 (Exact Date)   SpO2 99%   BMI 26.61 kg/m    Wt Readings from Last 3 Encounters:  05/11/24 159 lb 14.4 oz (72.5 kg)  04/11/24 160 lb 11.2 oz (72.9 kg)  03/06/24 163 lb (73.9 kg)    Physical Exam Constitutional:      Appearance: Normal appearance.  HENT:     Head: Normocephalic and atraumatic.     Right Ear: Tympanic membrane normal. There is no impacted cerumen.     Left Ear: Tympanic membrane normal. There is no impacted cerumen.  Eyes:  Conjunctiva/sclera: Conjunctivae normal.     Pupils: Pupils are equal, round, and reactive to light.  Cardiovascular:     Rate and Rhythm: Normal rate and regular rhythm.     Pulses: Normal pulses.     Heart sounds: Normal heart sounds.  Pulmonary:     Effort: Pulmonary effort is normal.     Breath sounds: Normal breath sounds.  Musculoskeletal:     Cervical back: Normal range of motion and neck supple.     Left hip: Tenderness present. No deformity. Normal strength.     Comments: Tenderness present with abduction of left hip   Skin:    General: Skin is warm and dry.  Neurological:     General: No focal deficit present.     Mental Status: She is alert and oriented to person, place, and time.  Psychiatric:        Mood and Affect: Mood normal.        Behavior: Behavior normal.        Thought Content: Thought content normal.               Assessment & Plan:   Problem List Items Addressed This Visit   None Visit Diagnoses       Bursitis of left hip, unspecified bursa    -  Primary   Contiue with ibuprofen  PRN for pain relief, warm epsom salt soaks, heating pad, and stretching. Talked about PT if no improvements.     Stinging of the ear, right       Informed patient of normal ear exam. Told patient to keep an eye on worsening symptoms.        Home Care: -Advised to keep an eye on worsening ear pain/stinging or discharge from ear  -Advised to avoid getting water in ears  -Discussed taking 600 mg twice a day one week before period starts when left hip and knee pain is at its worse  -Advised warm epsom salt soaks, warm compresses, heating pad, and stretching for left hip relief  -Discussed if pain worsens then we will send a referral into physical therapy.        Follow up plan: Return if symptoms worsen or fail to improve.   I have reviewed this encounter including the documentation in this note and/or discussed this patient with the provider, Aislinn Womack, SNP, I am certifying that I agree with the content of this note as supervising/preceptor nurse practitioner.  Mliss Spray, FNP-C [facility] 05/11/2024, 10:48 AM

## 2024-05-19 ENCOUNTER — Ambulatory Visit: Admitting: Clinical

## 2024-05-19 DIAGNOSIS — F411 Generalized anxiety disorder: Secondary | ICD-10-CM

## 2024-05-19 DIAGNOSIS — F40298 Other specified phobia: Secondary | ICD-10-CM

## 2024-05-19 DIAGNOSIS — F3341 Major depressive disorder, recurrent, in partial remission: Secondary | ICD-10-CM

## 2024-05-19 NOTE — Progress Notes (Signed)
 [facility] Counselor/Therapist Progress Note  Patient ID: Monique Barnett, MRN: [id number],    Date: 05/19/2024  Time Spent: 9:33am - 10:34am : 61 minutes   Treatment Type: Individual Therapy  Reported Symptoms: anxiety  Mental Status Exam: Appearance:  Neat and Well Groomed     Behavior: Appropriate  Motor: Normal  Speech/Language:  Clear and Coherent and Normal Rate  Affect: Appropriate  Mood: anxious  Thought process: normal  Thought content:   WNL  Sensory/Perceptual disturbances:   WNL  Orientation: oriented to person, place, time/date, and situation  Attention: Good  Concentration: Good  Memory: WNL  Fund of knowledge:  Good  Insight:   Good  Judgment:  Good  Impulse Control: Good   Risk Assessment: Danger to Self:  No Patient denied current suicidal ideation  Self-injurious Behavior: No Danger to Others: No Patient denied current homicidal ideation Duty to Warn:no Physical Aggression / Violence:No  Access to Firearms a concern: No  Gang Involvement:No   Subjective: Patient stated, its been ok in response to events since last session. Patient reported patient has an appointment with [facility] on May 22, 2024. Patient stated, Jesus been pretty sick this week and reported leg pain. Patient stated, Its been a little stressful because I have to take him to the doctor in reference to patient's son. Patient reported patient is moving September 28 th. Patient stated, I'm pretty stressed because I haven't lived there in a long time. Patient stated, I feel good, I think what's kind of added to the anxiety a little in reference to move. Patient reported significant other changing jobs due to upcoming move and meeting with attorneys today to discuss custody of significant other's daughter are current triggers for anxiety. Patient stated,  child custody gives me anxiety, it's made me a little anxious and a little sad. Patient  reported patient is upset by significant other's previous partner's perspective of patient. Patient stated, they really have a disdain for me in reference to significant other's family and patient's family. Patient stated, It makes me anxious because I have to be around these people a lot. Patient stated, holiday season is coming up and my anxiety is about to rise, its anxiety filled. Patient reported feeling anxious about upcoming life changes. Patient reported feeling anxious today.   Interventions: Cognitive Behavioral Therapy. Clinician conducted session via caregility video from clinician's home office. Patient provided verbal consent to proceed with telehealth session and is aware of limitations of telephone or video visits. Patient's son was present during today's session and patient provided verbal consent for son to be present during session. Patient participated in session from patient's home.  Reviewed events since last session and assessed for changes. Discussed current stressors. Processed patient's thoughts/feelings related to upcoming move. Explored and identified current triggers for anxiety. Provided psycho education related generating alternatives and explored alternatives to negative thoughts. Discussed family dynamics and interactions with others. Assisted patient in challenging thoughts related to family. Discussed strategies to decrease anxiety related to the holidays, such as, focusing on positive/enjoyable activities and participating in activities with positive supports in patient's life. Clinician requested patient continue thought record for homework and practice identifying evidence for/against cognitive distortions and generating alternatives.   Collaboration of Care: not required at this time   Diagnosis:  Generalized anxiety disorder   Specific phobia   Major depressive disorder, recurrent, in partial remission (HCC)     Plan: Patient is to utilize [employer] Therapy, thought re-framing, relaxation  techniques, behavioral activation, and coping strategies to decrease symptoms associated with their diagnosis. Frequency: bi-weekly  Modality: individual      Long-term goal:   Reduce overall level, frequency, and intensity of the feelings of depression and anxiety as evidenced by decreased sadness, lack of energy, loss of interest, difficulty staying asleep, feeling overwhelmed and anxious, fear of contacting an illness, panic attacks,  lack of appetite, muscle tension, restlessness, difficulty concentrating, worry from 7 days/week to 0 to 1 days/week per patient report for at least 3 consecutive months. Target Date: 12/05/24  Progress: progressing    Short-term goal:  Decrease symptoms of anxiety when changes occur in patient's plans/schedule per patient's report Target Date: 06/06/24  Progress: progressing    Decrease symptoms of anxiety and feelings of fear related to germs in public settings per patient's report Target Date: 06/06/24  Progress: progressing    Increase patient's participation in activities outside of the home patient enjoys from 1-2 times per month to 4 times per month  Target Date: 06/06/24  Progress: progressing    Identify, challenge, and replace negative thought patterns/cognitive distortions and negative self talk that contribute to feelings of anxiety and depression with positive thoughts, beliefs, and positive self talk per patient's report Target Date: 06/06/24  Progress: progressing    Decrease obsessive thoughts and compulsions to clean areas of patient's home prior to participation in other activities per patient's report Target Date: 06/06/24  Progress: progressing    Decrease negative thoughts and feelings of resentment towards patient's significant other and significant other's previous partner Target Date: 06/06/24  Progress: progressing       Darice Seats, LCSW

## 2024-05-19 NOTE — Progress Notes (Signed)
   Darice Seats, LCSW

## 2024-05-22 ENCOUNTER — Ambulatory Visit (INDEPENDENT_AMBULATORY_CARE_PROVIDER_SITE_OTHER): Admitting: Clinical

## 2024-05-22 DIAGNOSIS — F411 Generalized anxiety disorder: Secondary | ICD-10-CM | POA: Diagnosis not present

## 2024-05-22 DIAGNOSIS — F3341 Major depressive disorder, recurrent, in partial remission: Secondary | ICD-10-CM

## 2024-05-22 DIAGNOSIS — F40298 Other specified phobia: Secondary | ICD-10-CM | POA: Diagnosis not present

## 2024-05-22 NOTE — Progress Notes (Signed)
 [facility] Counselor/Therapist Progress Note  Patient ID: Monique Barnett, MRN: [id number],    Date: 05/22/2024  Time Spent: 3:33pm - 4:30pm : 57 minutes  Treatment Type: Individual Therapy  Reported Symptoms: anxiety/fear  Mental Status Exam: Appearance:  Neat and Well Groomed     Behavior: Appropriate  Motor: Normal  Speech/Language:  Clear and Coherent and Normal Rate  Affect: Appropriate  Mood: normal  Thought process: normal  Thought content:   WNL  Sensory/Perceptual disturbances:   WNL  Orientation: oriented to person, place, time/date, and situation  Attention: Good  Concentration: Good  Memory: WNL  Fund of knowledge:  Good  Insight:   Good  Judgment:  Good  Impulse Control: Good   Risk Assessment: Danger to Self:  No Patient denied current suicidal ideation  Self-injurious Behavior: No Danger to Others: No Patient denied current homicidal ideation Duty to Warn:no Physical Aggression / Violence:No  Access to Firearms a concern: No  Gang Involvement:No   Subjective: Patient stated, to sum it up I'm kind of annoyed in reference to weekend events. Patient reported significant other's previous partner reached out to patient through social media and patient reported feeling angry in response to the outreach. Patient reported patient did not respond to significant other's previous partner's message. Patient reported previous partner messaged patient's significant other multiple times throughout the weekend. Patient stated, It did make me very angry, It was kind of a downer weekend. Patient reported appointment with psychiatrist, Dr. Calton Oram at [facility], today to discuss medication and reported psychiatrist prescribed fluoxetine and hydroxyzine. Patient stated, I'm nervous to take either of them. Patient reported feeling nervous of side effects of medications. Patient reported crippling anxiety when it comes to sickness.  Patient reported patient's family members have experienced negative responses to medications. Patient stated, I've always been looking for someone to take care of me because I didn't have a father. Patient stated, I think my dad being absent from my life caused me to have anxiety. Patient stated, I think I have to change my own mind of what a partner should be. Patient stated, I don't think anybody has the father figure I'm imaging. Patient stated, Im too co dependent in reference to patient's mother, significant other, and son. Patient reported fear related to developing friendships and reported thoughts of what if they don't like me. Patient stated, Im feeling good in response to current mood. Patient stated, I felt better in response to discussion during session.    Interventions: Cognitive Behavioral Therapy. Clinician conducted session via caregility video from clinician's home office. Patient provided verbal consent to proceed with telehealth session and is aware of limitations of telephone or video visits. Patient participated in session from patient's home.  Reviewed events since last session and assessed for changes. Processed thoughts/feelings related to recent events. Explored positive aspects about recent weekend. Discussed visit with psychiatrist and psychiatrist's recommendations. Explored barriers to medications. Provided psycho education related to core beliefs. Explored and identified thoughts associated with feelings of anxiety and fear. Explored patient's expectations of a father. Challenged statements/thoughts to assist patient in reframing negative thought patterns.  Clinician reflected observations in thought patterns. Clinician requested for homework patient practice challenge/ identify evidence for/against fear associated with friendships.    Collaboration of Care: not required at this time   Diagnosis:  Generalized anxiety disorder   Specific phobia   Major  depressive disorder, recurrent, in partial remission (HCC)     Plan: Patient is to utilize  Cognitive Behavioral Therapy, thought re-framing, relaxation techniques, behavioral activation, and coping strategies to decrease symptoms associated with their diagnosis. Frequency: bi-weekly  Modality: individual      Long-term goal:   Reduce overall level, frequency, and intensity of the feelings of depression and anxiety as evidenced by decreased sadness, lack of energy, loss of interest, difficulty staying asleep, feeling overwhelmed and anxious, fear of contacting an illness, panic attacks,  lack of appetite, muscle tension, restlessness, difficulty concentrating, worry from 7 days/week to 0 to 1 days/week per patient report for at least 3 consecutive months. Target Date: 12/05/24  Progress: progressing    Short-term goal:  Decrease symptoms of anxiety when changes occur in patient's plans/schedule per patient's report Target Date: 06/06/24  Progress: progressing    Decrease symptoms of anxiety and feelings of fear related to germs in public settings per patient's report Target Date: 06/06/24  Progress: progressing    Increase patient's participation in activities outside of the home patient enjoys from 1-2 times per month to 4 times per month  Target Date: 06/06/24  Progress: progressing    Identify, challenge, and replace negative thought patterns/cognitive distortions and negative self talk that contribute to feelings of anxiety and depression with positive thoughts, beliefs, and positive self talk per patient's report Target Date: 06/06/24  Progress: progressing    Decrease obsessive thoughts and compulsions to clean areas of patient's home prior to participation in other activities per patient's report Target Date: 06/06/24  Progress: progressing    Decrease negative thoughts and feelings of resentment towards patient's significant other and significant other's previous partner Target  Date: 06/06/24  Progress: progressing       Darice Seats, LCSW

## 2024-05-22 NOTE — Progress Notes (Signed)
   Darice Seats, LCSW

## 2024-06-09 ENCOUNTER — Ambulatory Visit: Admitting: Clinical

## 2024-06-09 DIAGNOSIS — F40298 Other specified phobia: Secondary | ICD-10-CM | POA: Diagnosis not present

## 2024-06-09 DIAGNOSIS — F3341 Major depressive disorder, recurrent, in partial remission: Secondary | ICD-10-CM

## 2024-06-09 DIAGNOSIS — F411 Generalized anxiety disorder: Secondary | ICD-10-CM | POA: Diagnosis not present

## 2024-06-09 NOTE — Progress Notes (Signed)
 [facility] Counselor/Therapist Progress Note  Patient ID: Monique Barnett, MRN: [id number],    Date: 06/09/2024  Time Spent: 2:31pm - 3:30pm : 59 minutes   Treatment Type: Individual Therapy  Reported Symptoms: anxiety  Mental Status Exam: Appearance:  Neat and Well Groomed     Behavior: Appropriate  Motor: Normal  Speech/Language:  Clear and Coherent and Normal Rate  Affect: Appropriate  Mood: anxious  Thought process: normal  Thought content:   WNL  Sensory/Perceptual disturbances:   WNL  Orientation: oriented to person, place, time/date, and situation  Attention: Good  Concentration: Good  Memory: WNL  Fund of knowledge:  Good  Insight:   Good  Judgment:  Good  Impulse Control: Good   Risk Assessment: Danger to Self:  No Patient denied current suicidal ideation  Self-injurious Behavior: No Danger to Others: No Patient denied current homicidal ideation Duty to Warn:no Physical Aggression / Violence:No  Access to Firearms a concern: No  Gang Involvement:No   Subjective: Patient reported patient recently extended patient's lease for one month due to issues with the house patient is going to move into.  Patient stated, its been a project. Patient stated, it was a stressful time and stated, I was kind of annoyed for the weeks I've been there in reference to new home. Patient reported plans to move at the end of October. Patient stated, its been interesting, I just have to tell myself it's not forever in reference to new home. Patient reported patient attended an appointment with another therapist and stated, I'm just a little nervous. Patient stated, I haven't started my medication and reported a history of side effects when taking Zoloft . Patient reported plans to discuss patient's concerns related to medication with psychiatrist during patient's follow up appointment. Patient stated, I did something that I think is going to be really difficult  for me and reported patient contacted a local school for son to attend. Patient reported feeling anxious about son attending school. Patient stated, Its been really really difficult for me to think about, its going to be an interesting change because I've never been away from him for four hours in reference to son attending school. Patient reported patient is scheduled to meet with new therapist once weekly. Patient stated, I'm really glad I had this therapy.  Patient reported patient's relationship with significant other's daughter has improved and patient stated,  I really don't feel that anger anymore, I never thought I was going to feel that way, it really didn't seem possible. Patient stated, sad, I guess it's always sad to end something in reference to transitioning to new therapist. Patient stated, Its going to be interesting for this new person. Patient stated, I feel fine, I feel more in control of when I let anxiety in and when I don't. Patient stated, I didn't know how much control I could have over it (anxiety), I feel good. Patient stated, I think it'll it will be fine, I'm just a little nervous in reference to patient's transition to another therapist.   Interventions: Cognitive Behavioral Therapy. Clinician conducted session via caregility video from clinician's home office. Patient provided verbal consent to proceed with telehealth session and is aware of limitations of telephone or video visits. Patient participated in session from patient's home. Patient's son was present during today's session and patient provided verbal consent for son to be present. Reviewed events since last session and assessed for changes. Discussed stressors related to patient's move and coping strategies patient utilized  in response. Reviewed use of positive self talk. Reviewed challenging negative thoughts. Explored patient's thoughts/feelings regarding transitioning to another therapist. Reviewed  patient's progress in therapy. Praised patient's progress in therapy and commitment to treatment.    Collaboration of Care: Discussed consent required for release of information to new therapist.    Diagnosis:  Generalized anxiety disorder   Specific phobia   Major depressive disorder, recurrent, in partial remission (HCC)     Plan: Patient is to utilize [employer] [facility], thought re-framing, relaxation techniques, behavioral activation, and coping strategies to decrease symptoms associated with their diagnosis. Frequency: bi-weekly  Modality: individual      Long-term goal:   Reduce overall level, frequency, and intensity of the feelings of depression and anxiety as evidenced by decreased sadness, lack of energy, loss of interest, difficulty staying asleep, feeling overwhelmed and anxious, fear of contacting an illness, panic attacks,  lack of appetite, muscle tension, restlessness, difficulty concentrating, worry from 7 days/week to 0 to 1 days/week per patient report for at least 3 consecutive months. Target Date: 12/05/24  Progress: progressing    Short-term goal:  Decrease symptoms of anxiety when changes occur in patient's plans/schedule per patient's report Target Date: 06/06/24 - target dates will not be updated due to patient's transition to another clinician  Progress: progressing    Decrease symptoms of anxiety and feelings of fear related to germs in public settings per patient's report Target Date: 06/06/24 - target dates will not be updated due to patient's transition to another clinician  Progress: progressing    Increase patient's participation in activities outside of the home patient enjoys from 1-2 times per month to 4 times per month  Target Date: 06/06/24 - target dates will not be updated due to patient's transition to another clinician  Progress: progressing    Identify, challenge, and replace negative thought patterns/cognitive distortions and negative  self talk that contribute to feelings of anxiety and depression with positive thoughts, beliefs, and positive self talk per patient's report Target Date: 06/06/24 - target dates will not be updated due to patient's transition to another clinician  Progress: progressing    Decrease obsessive thoughts and compulsions to clean areas of patient's home prior to participation in other activities per patient's report Target Date: 06/06/24 - target dates will not be updated due to patient's transition to another clinician  Progress: progressing     Decrease negative thoughts and feelings of resentment towards patient's significant other and significant other's previous partner Target Date: 06/06/24 - target dates will not be updated due to patient's transition to another clinician  Progress: progressing       Darice Seats, LCSW

## 2024-06-09 NOTE — Progress Notes (Signed)
   Darice Seats, LCSW

## 2024-07-04 ENCOUNTER — Encounter: Payer: Self-pay | Admitting: Family Medicine

## 2024-07-04 ENCOUNTER — Ambulatory Visit: Admitting: Family Medicine

## 2024-07-04 VITALS — BP 122/68 | HR 81 | Resp 16 | Ht 65.0 in | Wt 156.0 lb

## 2024-07-04 DIAGNOSIS — N920 Excessive and frequent menstruation with regular cycle: Secondary | ICD-10-CM | POA: Diagnosis not present

## 2024-07-04 DIAGNOSIS — R233 Spontaneous ecchymoses: Secondary | ICD-10-CM | POA: Diagnosis not present

## 2024-07-04 DIAGNOSIS — M26629 Arthralgia of temporomandibular joint, unspecified side: Secondary | ICD-10-CM | POA: Insufficient documentation

## 2024-07-04 DIAGNOSIS — M26623 Arthralgia of bilateral temporomandibular joint: Secondary | ICD-10-CM | POA: Diagnosis not present

## 2024-07-04 DIAGNOSIS — F419 Anxiety disorder, unspecified: Secondary | ICD-10-CM

## 2024-07-04 NOTE — Progress Notes (Signed)
 Name: Monique Barnett   MRN: [id number]    DOB: Mar 17, 1996   Date:07/04/2024       Progress Note  Subjective  Chief Complaint  Chief Complaint  Patient presents with   leg bruises    B/l, x1 week. Hasn't noticed hitting it on anything    Discussed the use of AI scribe software for clinical note transcription with the patient, who gave verbal consent to proceed.  History of Present Illness Monique Barnett is a 28 year old female who presents with unexplained bruising on her legs.  She noticed bruising on her legs starting last week, initially with a bruise in the middle of her leg, which has since faded. Yesterday, she observed additional bruises on different parts of her legs, which are in various stages of healing. No recent trauma or changes in medication could explain the bruising, except for starting a low dose of fluoxetine (5 mg) about a week ago.  She has been moving houses over the past couple of weeks, involving lifting and pushing boxes. Her son's father suggested a possible vitamin deficiency, as she tends to bruise easily. She also experiences soreness in her legs, particularly during her menstrual cycle, which is accompanied by heavy bleeding and clotting. This has been a consistent issue since she was young.  Her past medical history includes TMJ issues and a history of heavy menstrual bleeding. She has tried various birth control methods in the past, which resulted in heart palpitations and other side effects, leading her to discontinue their use. She takes ibuprofen  600 mg during her menstrual cycle for pain management.  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 No bruising on her arms, chest, or back, and no significant changes in her health aside from the bruising.    Patient Active Problem List   Diagnosis Date Noted   Family history of endometriosis 03/02/2024   Generalized anxiety disorder  03/02/2024   Obsessive-compulsive disorder 03/02/2024   Follicular cyst of right ovary 10/12/2023   Overweight 10/12/2023   Anxiety 09/29/2023   Menorrhagia with regular cycle 10/27/2022   Situational depression 07/19/2020    Social History   Tobacco Use   Smoking status: Never   Smokeless tobacco: Never  Substance Use Topics   Alcohol use: Never     Current Outpatient Medications:    FLUoxetine (PROZAC) 10 MG tablet, Take 10 mg by mouth daily., Disp: , Rfl:    ibuprofen  (ADVIL ) 600 MG tablet, Take 1 tablet by mouth every 6 (six) hours as needed., Disp: , Rfl:    mupirocin ointment (BACTROBAN) 2 %, Apply 1 Application topically daily., Disp: , Rfl:   Allergies  Allergen Reactions   Penicillins Nausea And Vomiting, Anaphylaxis and Other (See Comments)    Penicillin   Cefdinir Rash    ROS  Ten systems reviewed and is negative except as mentioned in HPI    Objective  Vitals:   07/04/24 1300  BP: 122/68  Pulse: 81  Resp: 16  SpO2: 98%  Weight: 156 lb (70.8 kg)  Height: 5' 5 (1.651 m)    Body mass index is 25.96 kg/m.   Physical Exam CONSTITUTIONAL: Patient appears well-developed and well-nourished. No distress. HEENT: Head atraumatic, normocephalic, neck supple. CARDIOVASCULAR: Normal rate, regular rhythm and normal heart sounds. No murmur heard. No BLE edema. PULMONARY: Effort normal and breath sounds normal. No respiratory distress. ABDOMINAL: There is no tenderness  or distention. MUSCULOSKELETAL: Normal gait. Without gross motor or sensory deficit. PSYCHIATRIC: Patient has a normal mood and affect. Behavior is normal. Judgment and thought content normal. SKIN: Bruising on legs in different stages, no bruising on arms, chest, or back.  Recent Results (from the past 2160 hours)  Cervicovaginal ancillary only     Status: Abnormal   Collection Time: 04/11/24  8:45 AM  Result Value Ref Range   Bacterial Vaginitis (gardnerella) Positive (A)    Candida  Vaginitis Positive (A)    Candida Glabrata Negative    Comment Normal Reference Range Candida Species - Negative    Comment Normal Reference Range Candida Galbrata - Negative    Comment      Normal Reference Range Bacterial Vaginosis - Negative      Assessment & Plan Spontaneous ecchymoses (bruising) Bruising on legs likely due to recent physical activity. Considered medication side effects and vitamin deficiencies, but less likely. No signs of vasculitis or serious bleeding disorders. - Ordered CBC, PT, PTT, and INR to evaluate for bleeding disorders. - Advised monitoring bruising for four weeks; further evaluation if persistent.  Excessive and frequent menstruation with regular cycle Heavy menstrual bleeding with leg pain. Considered von Willebrand disease. She is considering endometrial ablation if no pregnancy planned by age 39. - Ordered von Willebrand factor test. - Discussed potential endometrial ablation if heavy bleeding persists and no pregnancy planned by age 12.  Anxiety Managed with fluoxetine, starting at 5 mg with plans to increase to 10 mg. No significant side effects. Psychiatrist involved. - Continue fluoxetine with planned increase to 10 mg as tolerated.  General Health Maintenance Discussed flu vaccination. She has not received it previously due to fear of injections. - Encouraged flu vaccination to prevent influenza.

## 2024-07-05 ENCOUNTER — Ambulatory Visit: Payer: Self-pay | Admitting: Family Medicine

## 2024-07-07 LAB — CBC WITH DIFFERENTIAL/PLATELET
Absolute Lymphocytes: 1614 {cells}/uL (ref 850–3900)
Absolute Monocytes: 390 {cells}/uL (ref 200–950)
Basophils Absolute: 30 {cells}/uL (ref 0–200)
Basophils Relative: 0.5 %
Eosinophils Absolute: 48 {cells}/uL (ref 15–500)
Eosinophils Relative: 0.8 %
HCT: 37.5 % (ref 35.0–45.0)
Hemoglobin: 12.5 g/dL (ref 11.7–15.5)
MCH: 31.3 pg (ref 27.0–33.0)
MCHC: 33.3 g/dL (ref 32.0–36.0)
MCV: 93.8 fL (ref 80.0–100.0)
MPV: 10.7 fL (ref 7.5–12.5)
Monocytes Relative: 6.5 %
Neutro Abs: 3918 {cells}/uL (ref 1500–7800)
Neutrophils Relative %: 65.3 %
Platelets: 219 Thousand/uL (ref 140–400)
RBC: 4 Million/uL (ref 3.80–5.10)
RDW: 12 % (ref 11.0–15.0)
Total Lymphocyte: 26.9 %
WBC: 6 Thousand/uL (ref 3.8–10.8)

## 2024-07-07 LAB — PROTIME-INR
INR: 1
Prothrombin Time: 10.8 s (ref 9.0–11.5)

## 2024-07-07 LAB — VON WILLEBRAND PANEL
Factor-VIII Activity: 67 %{normal} (ref 50–180)
Ristocetin Co-Factor: 86 %{normal} (ref 42–200)
Von Willebrand Antigen, Plasma: 79 % (ref 50–217)
aPTT: 29 s (ref 23–32)
